# Patient Record
Sex: Female | Born: 1975 | Race: Black or African American | Hispanic: Yes | Marital: Married | Smoking: Current every day smoker
Health system: Southern US, Community
[De-identification: ages and names within clinical notes are randomized; demographics above are authoritative.]

## PROBLEM LIST (undated history)

## (undated) DIAGNOSIS — J439 Emphysema, unspecified: Secondary | ICD-10-CM

## (undated) DIAGNOSIS — J449 Chronic obstructive pulmonary disease, unspecified: Secondary | ICD-10-CM

---

## 2004-05-03 ENCOUNTER — Emergency Department: Payer: Self-pay | Admitting: Unknown Physician Specialty

## 2004-05-04 ENCOUNTER — Ambulatory Visit: Payer: Self-pay | Admitting: Unknown Physician Specialty

## 2005-04-07 ENCOUNTER — Emergency Department: Payer: Self-pay | Admitting: Emergency Medicine

## 2006-05-06 ENCOUNTER — Emergency Department: Payer: Self-pay | Admitting: Internal Medicine

## 2006-06-01 ENCOUNTER — Emergency Department: Payer: Self-pay | Admitting: Emergency Medicine

## 2006-07-09 ENCOUNTER — Emergency Department: Payer: Self-pay | Admitting: Emergency Medicine

## 2007-08-19 ENCOUNTER — Emergency Department: Payer: Self-pay | Admitting: Emergency Medicine

## 2008-12-16 ENCOUNTER — Ambulatory Visit: Payer: Self-pay | Admitting: Internal Medicine

## 2009-03-14 ENCOUNTER — Emergency Department: Payer: Self-pay | Admitting: Unknown Physician Specialty

## 2009-10-27 ENCOUNTER — Observation Stay: Payer: Self-pay | Admitting: Obstetrics and Gynecology

## 2009-12-05 ENCOUNTER — Ambulatory Visit: Payer: Self-pay | Admitting: Family

## 2010-02-19 ENCOUNTER — Observation Stay: Payer: Self-pay

## 2010-02-26 ENCOUNTER — Inpatient Hospital Stay: Payer: Self-pay | Admitting: Obstetrics and Gynecology

## 2012-02-14 ENCOUNTER — Emergency Department: Payer: Self-pay | Admitting: Emergency Medicine

## 2012-10-15 ENCOUNTER — Emergency Department: Payer: Self-pay | Admitting: Emergency Medicine

## 2012-10-15 LAB — TROPONIN I: Troponin-I: <0.02 ng/mL

## 2012-10-15 LAB — URINALYSIS, COMPLETE
Bilirubin,UR: NEGATIVE
Glucose,UR: NEGATIVE mg/dL (ref 0–75)
Ketone: NEGATIVE
Nitrite: NEGATIVE
Ph: 6 (ref 4.5–8.0)
Protein: NEGATIVE
Specific Gravity: 1.005 (ref 1.003–1.030)
Squamous Epithelial: 5

## 2012-10-15 LAB — BASIC METABOLIC PANEL
Anion Gap: 5 — ABNORMAL LOW (ref 7–16)
BUN: 11 mg/dL (ref 7–18)
Chloride: 109 mmol/L — ABNORMAL HIGH (ref 98–107)
EGFR (African American): >60
EGFR (Non-African Amer.): >60
Osmolality: 276 (ref 275–301)

## 2012-10-15 LAB — CBC
MCHC: 32.8 g/dL (ref 32.0–36.0)
MCV: 94 fL (ref 80–100)

## 2013-04-20 ENCOUNTER — Ambulatory Visit: Payer: Self-pay | Admitting: Family Medicine

## 2013-05-06 ENCOUNTER — Ambulatory Visit: Payer: Self-pay | Admitting: Medical

## 2013-05-09 ENCOUNTER — Encounter: Payer: Self-pay | Admitting: Family Medicine

## 2014-12-25 ENCOUNTER — Ambulatory Visit
Admission: EM | Admit: 2014-12-25 | Discharge: 2014-12-25 | Disposition: A | Discharge status: Home / Self Care | Payer: Medicaid Other | Attending: Internal Medicine | Admitting: Internal Medicine

## 2014-12-25 DIAGNOSIS — M7541 Impingement syndrome of right shoulder: Secondary | ICD-10-CM

## 2014-12-25 HISTORY — DX: Chronic obstructive pulmonary disease, unspecified: J44.9

## 2014-12-25 NOTE — Discharge Instructions (Signed)
As you are already doing, rest when shoulder is getting sore. Take advil or aleve to help with discomfort. Ice alternating with heat to help with discomfort also. Do not overdo with the exercises in this handout, but use what seems helpful.  Impingement Syndrome, Rotator Cuff, Bursitis with Rehab Impingement syndrome is a condition that involves inflammation of the tendons of the rotator cuff and the subacromial bursa, that causes pain in the shoulder. The rotator cuff consists of four tendons and muscles that control much of the shoulder and upper arm function. The subacromial bursa is a fluid filled sac that helps reduce friction between the rotator cuff and one of the bones of the shoulder (acromion). Impingement syndrome is usually an overuse injury that causes swelling of the bursa (bursitis), swelling of the tendon (tendonitis), and/or a tear of the tendon (strain). Strains are classified into three categories. Grade 1 strains cause pain, but the tendon is not lengthened. Grade 2 strains include a lengthened ligament, due to the ligament being stretched or partially ruptured. With grade 2 strains there is still function, although the function may be decreased. Grade 3 strains include a complete tear of the tendon or muscle, and function is usually impaired. SYMPTOMS   Pain around the shoulder, often at the outer portion of the upper arm.  Pain that gets worse with shoulder function, especially when reaching overhead or lifting.  Sometimes, aching when not using the arm.  Pain that wakes you up at night.  Sometimes, tenderness, swelling, warmth, or redness over the affected area.  Loss of strength.  Limited motion of the shoulder, especially reaching behind the back (to the back pocket or to unhook bra) or across your body.  Crackling sound (crepitation) when moving the arm.  Biceps tendon pain and inflammation (in the front of the shoulder). Worse when bending the elbow or  lifting. CAUSES  Impingement syndrome is often an overuse injury, in which chronic (repetitive) motions cause the tendons or bursa to become inflamed. A strain occurs when a force is paced on the tendon or muscle that is greater than it can withstand. Common mechanisms of injury include: Stress from sudden increase in duration, frequency, or intensity of training.  Direct hit (trauma) to the shoulder.  Aging, erosion of the tendon with normal use.  Bony bump on shoulder (acromial spur). RISK INCREASES WITH:  Contact sports (football, wrestling, boxing).  Throwing sports (baseball, tennis, volleyball).  Weightlifting and bodybuilding.  Heavy labor.  Previous injury to the rotator cuff, including impingement.  Poor shoulder strength and flexibility.  Failure to warm up properly before activity.  Inadequate protective equipment.  Old age.  Bony bump on shoulder (acromial spur). PREVENTION   Warm up and stretch properly before activity.  Allow for adequate recovery between workouts.  Maintain physical fitness:  Strength, flexibility, and endurance.  Cardiovascular fitness.  Learn and use proper exercise technique. PROGNOSIS  If treated properly, impingement syndrome usually goes away within 6 weeks. Sometimes surgery is required.  RELATED COMPLICATIONS   Longer healing time if not properly treated, or if not given enough time to heal.  Recurring symptoms, that result in a chronic condition.  Shoulder stiffness, frozen shoulder, or loss of motion.  Rotator cuff tendon tear.  Recurring symptoms, especially if activity is resumed too soon, with overuse, with a direct blow, or when using poor technique. TREATMENT  Treatment first involves the use of ice and medicine, to reduce pain and inflammation. The use of strengthening and stretching  exercises may help reduce pain with activity. These exercises may be performed at home or with a therapist. If non-surgical  treatment is unsuccessful after more than 6 months, surgery may be advised. After surgery and rehabilitation, activity is usually possible in 3 months.  MEDICATION  If pain medicine is needed, nonsteroidal anti-inflammatory medicines (aspirin and ibuprofen), or other minor pain relievers (acetaminophen), are often advised.  Do not take pain medicine for 7 days before surgery.  Prescription pain relievers may be given, if your caregiver thinks they are needed. Use only as directed and only as much as you need.  Corticosteroid injections may be given by your caregiver. These injections should be reserved for the most serious cases, because they may only be given a certain number of times. HEAT AND COLD  Cold treatment (icing) should be applied for 10 to 15 minutes every 2 to 3 hours for inflammation and pain, and immediately after activity that aggravates your symptoms. Use ice packs or an ice massage.  Heat treatment may be used before performing stretching and strengthening activities prescribed by your caregiver, physical therapist, or athletic trainer. Use a heat pack or a warm water soak. SEEK MEDICAL CARE IF:   Symptoms get worse or do not improve in 4 to 6 weeks, despite treatment.  New, unexplained symptoms develop. (Drugs used in treatment may produce side effects.) EXERCISES  RANGE OF MOTION (ROM) AND STRETCHING EXERCISES - Impingement Syndrome (Rotator Cuff  Tendinitis, Bursitis) These exercises may help you when beginning to rehabilitate your injury. Your symptoms may go away with or without further involvement from your physician, physical therapist or athletic trainer. While completing these exercises, remember:   Restoring tissue flexibility helps normal motion to return to the joints. This allows healthier, less painful movement and activity.  An effective stretch should be held for at least 30 seconds.  A stretch should never be painful. You should only feel a gentle  lengthening or release in the stretched tissue. STRETCH - Flexion, Standing  Stand with good posture. With an underhand grip on your right / left hand, and an overhand grip on the opposite hand, grasp a broomstick or cane so that your hands are a little more than shoulder width apart.  Keeping your right / left elbow straight and shoulder muscles relaxed, push the stick with your opposite hand, to raise your right / left arm in front of your body and then overhead. Raise your arm until you feel a stretch in your right / left shoulder, but before you have increased shoulder pain.  Try to avoid shrugging your right / left shoulder as your arm rises, by keeping your shoulder blade tucked down and toward your mid-back spine. Hold for __________ seconds.  Slowly return to the starting position. Repeat __________ times. Complete this exercise __________ times per day. STRETCH - Abduction, Supine  Lie on your back. With an underhand grip on your right / left hand and an overhand grip on the opposite hand, grasp a broomstick or cane so that your hands are a little more than shoulder width apart.  Keeping your right / left elbow straight and your shoulder muscles relaxed, push the stick with your opposite hand, to raise your right / left arm out to the side of your body and then overhead. Raise your arm until you feel a stretch in your right / left shoulder, but before you have increased shoulder pain.  Try to avoid shrugging your right / left shoulder as your  arm rises, by keeping your shoulder blade tucked down and toward your mid-back spine. Hold for __________ seconds.  Slowly return to the starting position. Repeat __________ times. Complete this exercise __________ times per day. ROM - Flexion, Active-Assisted  Lie on your back. You may bend your knees for comfort.  Grasp a broomstick or cane so your hands are about shoulder width apart. Your right / left hand should grip the end of the stick,  so that your hand is positioned "thumbs-up," as if you were about to shake hands.  Using your healthy arm to lead, raise your right / left arm overhead, until you feel a gentle stretch in your shoulder. Hold for __________ seconds.  Use the stick to assist in returning your right / left arm to its starting position. Repeat __________ times. Complete this exercise __________ times per day.  ROM - Internal Rotation, Supine   Lie on your back on a firm surface. Place your right / left elbow about 60 degrees away from your side. Elevate your elbow with a folded towel, so that the elbow and shoulder are the same height.  Using a broomstick or cane and your strong arm, pull your right / left hand toward your body until you feel a gentle stretch, but no increase in your shoulder pain. Keep your shoulder and elbow in place throughout the exercise.  Hold for __________ seconds. Slowly return to the starting position. Repeat __________ times. Complete this exercise __________ times per day. STRETCH - Internal Rotation  Place your right / left hand behind your back, palm up.  Throw a towel or belt over your opposite shoulder. Grasp the towel with your right / left hand.  While keeping an upright posture, gently pull up on the towel, until you feel a stretch in the front of your right / left shoulder.  Avoid shrugging your right / left shoulder as your arm rises, by keeping your shoulder blade tucked down and toward your mid-back spine.  Hold for __________ seconds. Release the stretch, by lowering your healthy hand. Repeat __________ times. Complete this exercise __________ times per day. ROM - Internal Rotation   Using an underhand grip, grasp a stick behind your back with both hands.  While standing upright with good posture, slide the stick up your back until you feel a mild stretch in the front of your shoulder.  Hold for __________ seconds. Slowly return to your starting position. Repeat  __________ times. Complete this exercise __________ times per day.  STRETCH - Posterior Shoulder Capsule   Stand or sit with good posture. Grasp your right / left elbow and draw it across your chest, keeping it at the same height as your shoulder.  Pull your elbow, so your upper arm comes in closer to your chest. Pull until you feel a gentle stretch in the back of your shoulder.  Hold for __________ seconds. Repeat __________ times. Complete this exercise __________ times per day. STRENGTHENING EXERCISES - Impingement Syndrome (Rotator Cuff Tendinitis, Bursitis) These exercises may help you when beginning to rehabilitate your injury. They may resolve your symptoms with or without further involvement from your physician, physical therapist or athletic trainer. While completing these exercises, remember:  Muscles can gain both the endurance and the strength needed for everyday activities through controlled exercises.  Complete these exercises as instructed by your physician, physical therapist or athletic trainer. Increase the resistance and repetitions only as guided.  You may experience muscle soreness or fatigue, but the pain  or discomfort you are trying to eliminate should never worsen during these exercises. If this pain does get worse, stop and make sure you are following the directions exactly. If the pain is still present after adjustments, discontinue the exercise until you can discuss the trouble with your clinician.  During your recovery, avoid activity or exercises which involve actions that place your injured hand or elbow above your head or behind your back or head. These positions stress the tissues which you are trying to heal. STRENGTH - Scapular Depression and Adduction   With good posture, sit on a firm chair. Support your arms in front of you, with pillows, arm rests, or on a table top. Have your elbows in line with the sides of your body.  Gently draw your shoulder blades  down and toward your mid-back spine. Gradually increase the tension, without tensing the muscles along the top of your shoulders and the back of your neck.  Hold for __________ seconds. Slowly release the tension and relax your muscles completely before starting the next repetition.  After you have practiced this exercise, remove the arm support and complete the exercise in standing as well as sitting position. Repeat __________ times. Complete this exercise __________ times per day.  STRENGTH - Shoulder Abductors, Isometric  With good posture, stand or sit about 4-6 inches from a wall, with your right / left side facing the wall.  Bend your right / left elbow. Gently press your right / left elbow into the wall. Increase the pressure gradually, until you are pressing as hard as you can, without shrugging your shoulder or increasing any shoulder discomfort.  Hold for __________ seconds.  Release the tension slowly. Relax your shoulder muscles completely before you begin the next repetition. Repeat __________ times. Complete this exercise __________ times per day.  STRENGTH - External Rotators, Isometric  Keep your right / left elbow at your side and bend it 90 degrees.  Step into a door frame so that the outside of your right / left wrist can press against the door frame without your upper arm leaving your side.  Gently press your right / left wrist into the door frame, as if you were trying to swing the back of your hand away from your stomach. Gradually increase the tension, until you are pressing as hard as you can, without shrugging your shoulder or increasing any shoulder discomfort.  Hold for __________ seconds.  Release the tension slowly. Relax your shoulder muscles completely before you begin the next repetition. Repeat __________ times. Complete this exercise __________ times per day.  STRENGTH - Supraspinatus   Stand or sit with good posture. Grasp a __________ weight, or an  exercise band or tubing, so that your hand is "thumbs-up," like you are shaking hands.  Slowly lift your right / left arm in a "V" away from your thigh, diagonally into the space between your side and straight ahead. Lift your hand to shoulder height or as far as you can, without increasing any shoulder pain. At first, many people do not lift their hands above shoulder height.  Avoid shrugging your right / left shoulder as your arm rises, by keeping your shoulder blade tucked down and toward your mid-back spine.  Hold for __________ seconds. Control the descent of your hand, as you slowly return to your starting position. Repeat __________ times. Complete this exercise __________ times per day.  STRENGTH - External Rotators  Secure a rubber exercise band or tubing to a fixed  object (table, pole) so that it is at the same height as your right / left elbow when you are standing or sitting on a firm surface.  Stand or sit so that the secured exercise band is at your uninjured side.  Bend your right / left elbow 90 degrees. Place a folded towel or small pillow under your right / left arm, so that your elbow is a few inches away from your side.  Keeping the tension on the exercise band, pull it away from your body, as if pivoting on your elbow. Be sure to keep your body steady, so that the movement is coming only from your rotating shoulder.  Hold for __________ seconds. Release the tension in a controlled manner, as you return to the starting position. Repeat __________ times. Complete this exercise __________ times per day.  STRENGTH - Internal Rotators   Secure a rubber exercise band or tubing to a fixed object (table, pole) so that it is at the same height as your right / left elbow when you are standing or sitting on a firm surface.  Stand or sit so that the secured exercise band is at your right / left side.  Bend your elbow 90 degrees. Place a folded towel or small pillow under your right  / left arm so that your elbow is a few inches away from your side.  Keeping the tension on the exercise band, pull it across your body, toward your stomach. Be sure to keep your body steady, so that the movement is coming only from your rotating shoulder.  Hold for __________ seconds. Release the tension in a controlled manner, as you return to the starting position. Repeat __________ times. Complete this exercise __________ times per day.  STRENGTH - Scapular Protractors, Standing   Stand arms length away from a wall. Place your hands on the wall, keeping your elbows straight.  Begin by dropping your shoulder blades down and toward your mid-back spine.  To strengthen your protractors, keep your shoulder blades down, but slide them forward on your rib cage. It will feel as if you are lifting the back of your rib cage away from the wall. This is a subtle motion and can be challenging to complete. Ask your caregiver for further instruction, if you are not sure you are doing the exercise correctly.  Hold for __________ seconds. Slowly return to the starting position, resting the muscles completely before starting the next repetition. Repeat __________ times. Complete this exercise __________ times per day. STRENGTH - Scapular Protractors, Supine  Lie on your back on a firm surface. Extend your right / left arm straight into the air while holding a __________ weight in your hand.  Keeping your head and back in place, lift your shoulder off the floor.  Hold for __________ seconds. Slowly return to the starting position, and allow your muscles to relax completely before starting the next repetition. Repeat __________ times. Complete this exercise __________ times per day. STRENGTH - Scapular Protractors, Quadruped  Get onto your hands and knees, with your shoulders directly over your hands (or as close as you can be, comfortably).  Keeping your elbows locked, lift the back of your rib cage up  into your shoulder blades, so your mid-back rounds out. Keep your neck muscles relaxed.  Hold this position for __________ seconds. Slowly return to the starting position and allow your muscles to relax completely before starting the next repetition. Repeat __________ times. Complete this exercise __________ times per day.  STRENGTH - Scapular Retractors  Secure a rubber exercise band or tubing to a fixed object (table, pole), so that it is at the height of your shoulders when you are either standing, or sitting on a firm armless chair.  With a palm down grip, grasp an end of the band in each hand. Straighten your elbows and lift your hands straight in front of you, at shoulder height. Step back, away from the secured end of the band, until it becomes tense.  Squeezing your shoulder blades together, draw your elbows back toward your sides, as you bend them. Keep your upper arms lifted away from your body throughout the exercise.  Hold for __________ seconds. Slowly ease the tension on the band, as you reverse the directions and return to the starting position. Repeat __________ times. Complete this exercise __________ times per day. STRENGTH - Shoulder Extensors   Secure a rubber exercise band or tubing to a fixed object (table, pole) so that it is at the height of your shoulders when you are either standing, or sitting on a firm armless chair.  With a thumbs-up grip, grasp an end of the band in each hand. Straighten your elbows and lift your hands straight in front of you, at shoulder height. Step back, away from the secured end of the band, until it becomes tense.  Squeezing your shoulder blades together, pull your hands down to the sides of your thighs. Do not allow your hands to go behind you.  Hold for __________ seconds. Slowly ease the tension on the band, as you reverse the directions and return to the starting position. Repeat __________ times. Complete this exercise __________ times  per day.  STRENGTH - Scapular Retractors and External Rotators   Secure a rubber exercise band or tubing to a fixed object (table, pole) so that it is at the height as your shoulders, when you are either standing, or sitting on a firm armless chair.  With a palm down grip, grasp an end of the band in each hand. Bend your elbows 90 degrees and lift your elbows to shoulder height, at your sides. Step back, away from the secured end of the band, until it becomes tense.  Squeezing your shoulder blades together, rotate your shoulders so that your upper arms and elbows remain stationary, but your fists travel upward to head height.  Hold for __________ seconds. Slowly ease the tension on the band, as you reverse the directions and return to the starting position. Repeat __________ times. Complete this exercise __________ times per day.  STRENGTH - Scapular Retractors and External Rotators, Rowing   Secure a rubber exercise band or tubing to a fixed object (table, pole) so that it is at the height of your shoulders, when you are either standing, or sitting on a firm armless chair.  With a palm down grip, grasp an end of the band in each hand. Straighten your elbows and lift your hands straight in front of you, at shoulder height. Step back, away from the secured end of the band, until it becomes tense.  Step 1: Squeeze your shoulder blades together. Bending your elbows, draw your hands to your chest, as if you are rowing a boat. At the end of this motion, your hands and elbow should be at shoulder height and your elbows should be out to your sides.  Step 2: Rotate your shoulders, to raise your hands above your head. Your forearms should be vertical and your upper arms should be horizontal.  Hold for __________ seconds. Slowly ease the tension on the band, as you reverse the directions and return to the starting position. Repeat __________ times. Complete this exercise __________ times per day.   STRENGTH - Scapular Depressors  Find a sturdy chair without wheels, such as a dining room chair.  Keeping your feet on the floor, and your hands on the chair arms, lift your bottom up from the seat, and lock your elbows.  Keeping your elbows straight, allow gravity to pull your body weight down. Your shoulders will rise toward your ears.  Raise your body against gravity by drawing your shoulder blades down your back, shortening the distance between your shoulders and ears. Although your feet should always maintain contact with the floor, your feet should progressively support less body weight, as you get stronger.  Hold for __________ seconds. In a controlled and slow manner, lower your body weight to begin the next repetition. Repeat __________ times. Complete this exercise __________ times per day.  Document Released: 06/16/2005 Document Revised: 09/08/2011 Document Reviewed: 09/28/2008 [facility] Patient Information 2015 [place], [place]. This information is not intended to replace advice given to you by your health care provider. Make sure you discuss any questions you have with your health care provider.
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
## 2014-12-25 NOTE — ED Notes (Signed)
Was lifting heavy boxes on Thursday (5 days ago) and right shoulder felt sore afterward. "have always had some shoulder problem before that is usually worst during raining days). Pt put ice on it and it did helped. Denies taking any home medications. Wants to come in to have it check prior to returning back to work. Full ROM.

## 2015-03-05 ENCOUNTER — Ambulatory Visit: Payer: Medicaid Other

## 2015-03-05 ENCOUNTER — Ambulatory Visit
Admission: EM | Admit: 2015-03-05 | Discharge: 2015-03-05 | Disposition: A | Discharge status: Home / Self Care | Payer: Medicaid Other | Attending: Family Medicine | Admitting: Family Medicine

## 2015-03-05 ENCOUNTER — Encounter: Payer: Self-pay | Admitting: Emergency Medicine

## 2015-03-05 DIAGNOSIS — W228XXA Striking against or struck by other objects, initial encounter: Secondary | ICD-10-CM | POA: Diagnosis not present

## 2015-03-05 DIAGNOSIS — S9032XA Contusion of left foot, initial encounter: Secondary | ICD-10-CM | POA: Diagnosis not present

## 2015-03-05 DIAGNOSIS — S90112A Contusion of left great toe without damage to nail, initial encounter: Secondary | ICD-10-CM | POA: Diagnosis not present

## 2015-03-05 DIAGNOSIS — F17210 Nicotine dependence, cigarettes, uncomplicated: Secondary | ICD-10-CM | POA: Insufficient documentation

## 2015-03-05 DIAGNOSIS — J449 Chronic obstructive pulmonary disease, unspecified: Secondary | ICD-10-CM | POA: Diagnosis not present

## 2015-03-05 DIAGNOSIS — S99822A Other specified injuries of left foot, initial encounter: Secondary | ICD-10-CM | POA: Diagnosis present

## 2015-03-05 DIAGNOSIS — Y939 Activity, unspecified: Secondary | ICD-10-CM | POA: Diagnosis not present

## 2015-03-05 MED ORDER — TRAMADOL HCL 50 MG PO TABS: 50.0000 mg | ORAL_TABLET | Freq: Three times a day (TID) | ORAL | Status: DC | PRN

## 2015-03-05 MED ORDER — IBUPROFEN 600 MG PO TABS: 600.0000 mg | ORAL_TABLET | Freq: Three times a day (TID) | ORAL | Status: DC | PRN
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
## 2015-03-05 NOTE — Discharge Instructions (Signed)
Take medication as prescribed. Apply ice and elevate. Use post-operative shoe and crutches for 2-3 days or as long as pain continues.   Follow up with your primary care physician or orthopedic above next week as needed for continued pain. Return to Urgent care for new or worsening concerns.   Contusion A contusion is a deep bruise. Contusions are the result of an injury that caused bleeding under the skin. The contusion may turn blue, purple, or yellow. Minor injuries will give you a painless contusion, but more severe contusions may stay painful and swollen for a few weeks.  CAUSES  A contusion is usually caused by a blow, trauma, or direct force to an area of the body. SYMPTOMS   Swelling and redness of the injured area.  Bruising of the injured area.  Tenderness and soreness of the injured area.  Pain. DIAGNOSIS  The diagnosis can be made by taking a history and physical exam. An X-ray, CT scan, or MRI may be needed to determine if there were any associated injuries, such as fractures. TREATMENT  Specific treatment will depend on what area of the body was injured. In general, the best treatment for a contusion is resting, icing, elevating, and applying cold compresses to the injured area. Over-the-counter medicines may also be recommended for pain control. Ask your caregiver what the best treatment is for your contusion. HOME CARE INSTRUCTIONS   Put ice on the injured area.  Put ice in a plastic bag.  Place a towel between your skin and the bag.  Leave the ice on for 15-20 minutes, 3-4 times a day, or as directed by your health care provider.  Only take over-the-counter or prescription medicines for pain, discomfort, or fever as directed by your caregiver. Your caregiver may recommend avoiding anti-inflammatory medicines (aspirin, ibuprofen, and naproxen) for 48 hours because these medicines may increase bruising.  Rest the injured area.  If possible, elevate the injured area to  reduce swelling. SEEK IMMEDIATE MEDICAL CARE IF:   You have increased bruising or swelling.  You have pain that is getting worse.  Your swelling or pain is not relieved with medicines. MAKE SURE YOU:   Understand these instructions.  Will watch your condition.  Will get help right away if you are not doing well or get worse. Document Released: 03/26/2005 Document Revised: 06/21/2013 Document Reviewed: 04/21/2011 [facility] Patient Information 2015 [place], [place]. This information is not intended to replace advice given to you by your health care provider. Make sure you discuss any questions you have with your health care provider.

## 2015-10-29 ENCOUNTER — Emergency Department: Payer: Medicaid Other

## 2015-10-29 ENCOUNTER — Encounter: Payer: Self-pay | Admitting: Emergency Medicine

## 2015-10-29 ENCOUNTER — Emergency Department
Admission: EM | Admit: 2015-10-29 | Discharge: 2015-10-29 | Disposition: A | Discharge status: Home / Self Care | Payer: Medicaid Other | Attending: Emergency Medicine | Admitting: Emergency Medicine

## 2015-10-29 DIAGNOSIS — F438 Other reactions to severe stress: Secondary | ICD-10-CM | POA: Insufficient documentation

## 2015-10-29 DIAGNOSIS — Z659 Problem related to unspecified psychosocial circumstances: Secondary | ICD-10-CM

## 2015-10-29 DIAGNOSIS — J441 Chronic obstructive pulmonary disease with (acute) exacerbation: Secondary | ICD-10-CM | POA: Diagnosis not present

## 2015-10-29 DIAGNOSIS — F17210 Nicotine dependence, cigarettes, uncomplicated: Secondary | ICD-10-CM | POA: Diagnosis not present

## 2015-10-29 DIAGNOSIS — R0789 Other chest pain: Secondary | ICD-10-CM | POA: Diagnosis present

## 2015-10-29 LAB — COMPREHENSIVE METABOLIC PANEL
ALT: 12 U/L — AB (ref 14–54)
ANION GAP: 8 (ref 5–15)
AST: 13 U/L — ABNORMAL LOW (ref 15–41)
Albumin: 4.6 g/dL (ref 3.5–5.0)
Alkaline Phosphatase: 63 U/L (ref 38–126)
BUN: 10 mg/dL (ref 6–20)
CHLORIDE: 108 mmol/L (ref 101–111)
CO2: 24 mmol/L (ref 22–32)
CREATININE: 0.69 mg/dL (ref 0.44–1.00)
Calcium: 9.2 mg/dL (ref 8.9–10.3)
GFR calc non Af Amer: >60 mL/min (ref >60)
Glucose, Bld: 103 mg/dL — ABNORMAL HIGH (ref 65–99)
POTASSIUM: 3.6 mmol/L (ref 3.5–5.1)
SODIUM: 140 mmol/L (ref 135–145)
Total Bilirubin: 0.9 mg/dL (ref 0.3–1.2)
Total Protein: 7.5 g/dL (ref 6.5–8.1)

## 2015-10-29 LAB — CBC
HCT: 39.1 % (ref 35.0–47.0)
Hemoglobin: 13.2 g/dL (ref 12.0–16.0)
MCH: 31.4 pg (ref 26.0–34.0)
MCHC: 33.6 g/dL (ref 32.0–36.0)
MCV: 93.4 fL (ref 80.0–100.0)
PLATELETS: 245 10*3/uL (ref 150–440)
RBC: 4.18 MIL/uL (ref 3.80–5.20)
RDW: 13.6 % (ref 11.5–14.5)
WBC: 9 10*3/uL (ref 3.6–11.0)

## 2015-10-29 LAB — TROPONIN I: Troponin I: <0.03 ng/mL (ref <0.031)

## 2015-10-29 MED ORDER — LORAZEPAM 0.5 MG PO TABS: 0.5000 mg | ORAL_TABLET | Freq: Every evening | ORAL | Status: AC | PRN

## 2015-10-29 MED ORDER — IBUPROFEN 400 MG PO TABS
600.0000 mg | ORAL_TABLET | ORAL | Status: AC
  Administered 2015-10-29: 600 mg via ORAL

## 2015-10-29 MED ORDER — IPRATROPIUM-ALBUTEROL 0.5-2.5 (3) MG/3ML IN SOLN
RESPIRATORY_TRACT | Status: AC
  Administered 2015-10-29: 3 mL via RESPIRATORY_TRACT
  Filled 2015-10-29: qty 3

## 2015-10-29 MED ORDER — PREDNISONE 20 MG PO TABS
40.0000 mg | ORAL_TABLET | Freq: Once | ORAL | Status: AC
  Administered 2015-10-29: 40 mg via ORAL

## 2015-10-29 MED ORDER — NICOTINE 21 MG/24HR TD PT24
MEDICATED_PATCH | TRANSDERMAL | Status: AC
  Administered 2015-10-29: 21 mg via TRANSDERMAL
  Filled 2015-10-29: qty 1

## 2015-10-29 MED ORDER — IBUPROFEN 600 MG PO TABS
ORAL_TABLET | ORAL | Status: AC
  Administered 2015-10-29: 600 mg via ORAL
  Filled 2015-10-29: qty 1

## 2015-10-29 MED ORDER — ALBUTEROL SULFATE HFA 108 (90 BASE) MCG/ACT IN AERS: 2.0000 | INHALATION_SPRAY | Freq: Four times a day (QID) | RESPIRATORY_TRACT | Status: DC | PRN

## 2015-10-29 MED ORDER — PREDNISONE 20 MG PO TABS
ORAL_TABLET | ORAL | Status: AC
  Administered 2015-10-29: 40 mg via ORAL
  Filled 2015-10-29: qty 2

## 2015-10-29 MED ORDER — IPRATROPIUM-ALBUTEROL 0.5-2.5 (3) MG/3ML IN SOLN
3.0000 mL | Freq: Once | RESPIRATORY_TRACT | Status: AC
  Administered 2015-10-29: 3 mL via RESPIRATORY_TRACT

## 2015-10-29 MED ORDER — NICOTINE 21 MG/24HR TD PT24
21.0000 mg | MEDICATED_PATCH | Freq: Once | TRANSDERMAL | Status: DC
  Administered 2015-10-29: 21 mg via TRANSDERMAL

## 2015-10-29 MED ORDER — PREDNISONE 20 MG PO TABS: 40.0000 mg | ORAL_TABLET | Freq: Every day | ORAL | Status: DC

## 2015-10-29 NOTE — Discharge Instructions (Signed)
We believe that your symptoms are caused today by an exacerbation of your COPD, anxiety, and possibly bronchitis.  Please take the prescribed medications and any medications that you have at home for your COPD.  Follow up with your doctor as recommended.  If you develop any new or worsening symptoms, including but not limited to fever, persistent vomiting, worsening shortness of breath, or other symptoms that concern you, please return to the Emergency Department immediately.  No driving within 8 hours of using Ativan.   Chronic Obstructive Pulmonary Disease Exacerbation Chronic obstructive pulmonary disease (COPD) is a common lung condition in which airflow from the lungs is limited. COPD is a general term that can be used to describe many different lung problems that limit airflow, including chronic bronchitis and emphysema. COPD exacerbations are episodes when breathing symptoms become much worse and require extra treatment. Without treatment, COPD exacerbations can be life threatening, and frequent COPD exacerbations can cause further damage to your lungs. CAUSES  Respiratory infections.  Exposure to smoke.  Exposure to air pollution, chemical fumes, or dust. Sometimes there is no apparent cause or trigger. RISK FACTORS  Smoking cigarettes.  Older age.  Frequent prior COPD exacerbations. SIGNS AND SYMPTOMS  Increased coughing.  Increased thick spit (sputum) production.  Increased wheezing.  Increased shortness of breath.  Rapid breathing.  Chest tightness. DIAGNOSIS Your medical history, a physical exam, and tests will help your health care provider make a diagnosis. Tests may include:  A chest X-ray.  Basic lab tests.  Sputum testing.  An arterial blood gas test. TREATMENT Depending on the severity of your COPD exacerbation, you may need to be admitted to a hospital for treatment. Some of the treatments commonly used to treat COPD exacerbations are:   Antibiotic  medicines.  Bronchodilators. These are drugs that expand the air passages. They may be given with an inhaler or nebulizer. Spacer devices may be needed to help improve drug delivery.  Corticosteroid medicines.  Supplemental oxygen therapy.  Airway clearing techniques, such as noninvasive ventilation (NIV) and positive expiratory pressure (PEP). These provide respiratory support through a mask or other noninvasive device. HOME CARE INSTRUCTIONS  Do not smoke. Quitting smoking is very important to prevent COPD from getting worse and exacerbations from happening as often.  Avoid exposure to all substances that irritate the airway, especially to tobacco smoke.  If you were prescribed an antibiotic medicine, finish it all even if you start to feel better.  Take all medicines as directed by your health care provider.It is important to use correct technique with inhaled medicines.  Drink enough fluids to keep your urine clear or pale yellow (unless you have a medical condition that requires fluid restriction).  Use a cool mist vaporizer. This makes it easier to clear your chest when you cough.  If you have a home nebulizer and oxygen, continue to use them as directed.  Maintain all necessary vaccinations to prevent infections.  Exercise regularly.  Eat a healthy diet.  Keep all follow-up appointments as directed by your health care provider. SEEK IMMEDIATE MEDICAL CARE IF:  You have worsening shortness of breath.  You have trouble talking.  You have severe chest pain.  You have blood in your sputum.  You have a fever.  You have weakness, vomit repeatedly, or faint.  You feel confused.  You continue to get worse. MAKE SURE YOU:  Understand these instructions.  Will watch your condition.  Will get help right away if you are not doing  well or get worse.   This information is not intended to replace advice given to you by your health care provider. Make sure you discuss  any questions you have with your health care provider.   Document Released: 04/13/2007 Document Revised: 07/07/2014 Document Reviewed: 02/18/2013 Else[employer] Interactive Patient Education [employer]

## 2015-10-29 NOTE — ED Notes (Signed)
Pt arrived to the ED for complaints of chest pain x3 days. Pt reports that she is a COPD but this feel different. Pt is AOx4 in no apparent distress.

## 2015-10-29 NOTE — ED Notes (Signed)
E-signature not working for this RN. E-signature page printed out and pt signed on paper.  

## 2015-10-29 NOTE — ED Provider Notes (Signed)
[facility] Emergency Department Provider Note  ____________________________________________  Time seen: Approximately 10:03 PM  I have reviewed the triage vital signs and the nursing notes.   HISTORY  Chief Complaint Chest Pain    HPI Lindsey Campos is a 40 y.o. female history of COPD. Also anxiety.  Patient reports she been feeling tight across her chest kind of "uncomfortable" for the last 3 days. She reports that this seems to be associated with some anxiety and stress related to her 63 year old child.  She denies any severe chest pain, sharp pain, pain is not worsened with inspiration.  Pain is located across the sternum. No associated nausea or vomiting. No radiation of pain. Does not describe it as a heavy chest pain or pressure. She reports it seems to be coming about especially when she gets upset with her 38 year old. She has had a slight nonproductive cough over the last few days as well and occasionally no slight wheezing. Currently not wheezing and denies shortness of breath.  No previous history of blood clots. Denies use any estrogen or birth control. She is a smoker. No leg swelling. No recent trauma or surgery. No productive cough or blood in her sputum.  No long trips or travel.  No history of heart disease. No diabetes, high blood pressure or high cholesterol.  Past Medical History  Diagnosis Date  . COPD (chronic obstructive pulmonary disease) (HCC)     There are no active problems to display for this patient.   History reviewed. No pertinent past surgical history.  Current Outpatient Rx  Name  Route  Sig  Dispense  Refill  . albuterol (PROVENTIL HFA;VENTOLIN HFA) 108 (90 Base) MCG/ACT inhaler   Inhalation   Inhale 2 puffs into the lungs every 6 (six) hours as needed for wheezing or shortness of breath.   1 Inhaler   2   . ibuprofen (ADVIL,MOTRIN) 600 MG tablet   Oral   Take 1 tablet (600 mg total) by mouth every 8 (eight)  hours as needed for mild pain or moderate pain.   15 tablet   0   . LORazepam (ATIVAN) 0.5 MG tablet   Oral   Take 1 tablet (0.5 mg total) by mouth at bedtime as needed for anxiety.   5 tablet   0   . predniSONE (DELTASONE) 20 MG tablet   Oral   Take 2 tablets (40 mg total) by mouth daily.   10 tablet   0   . traMADol (ULTRAM) 50 MG tablet   Oral   Take 1 tablet (50 mg total) by mouth every 8 (eight) hours as needed (Do not drive or operate machinery while taking as can cause drowsiness.).   10 tablet   0     Allergies Review of patient's allergies indicates no known allergies.  History reviewed. No pertinent family history.  Social History Social History  Substance Use Topics  . Smoking status: Current Every Day Smoker -- 1.00 packs/day    Types: Cigarettes  . Smokeless tobacco: None  . Alcohol Use: Yes     Comment: occasionally    Review of Systems Constitutional: No fever/chills Eyes: No visual changes. ENT: No sore throat. Cardiovascular: See history of present illness Respiratory: Denies shortness of breath. See history of present illness Gastrointestinal: No abdominal pain.  No nausea, no vomiting.  No diarrhea.  No constipation. Genitourinary: Negative for dysuria. Musculoskeletal: Negative for back pain. Skin: Negative for rash. Neurological: Negative for headaches, focal weakness or  numbness.  Denies any desire to harm herself, feeling depressed, or having any hallucinations. She does report  being anxious and feeling "stressed out and" at times with her teenage child. Denies any risk for being at risk to harm herself or any one else. Feels safe at home.  10-point ROS otherwise negative.  Denies pregnancy, uses contraceptive measures. Last menstrual period about 2 weeks ago.. ____________________________________________   PHYSICAL EXAM:  VITAL SIGNS: ED Triage Vitals  Enc Vitals Group     BP 10/29/15 2039 124/93 mmHg     Pulse Rate 10/29/15  2039 85     Resp 10/29/15 2039 18     Temp 10/29/15 2039 98.7 F (37.1 C)     Temp Source 10/29/15 2039 Oral     SpO2 10/29/15 2039 100 %     Weight 10/29/15 2039 117 lb (53.071 kg)     Height 10/29/15 2039 5\' 4"  (1.626 m)     Head Cir --      Peak Flow --      Pain Score 10/29/15 2040 8     Pain Loc --      Pain Edu? --      Excl. in GC? --    Constitutional: Alert and oriented. Well appearing and in no acute distress. Slightly anxious. Become slightly tearful and she discusses stress related to her teenage child. Eyes: Conjunctivae are normal. PERRL. EOMI. Head: Atraumatic. Nose: No congestion/rhinnorhea. Mouth/Throat: Mucous membranes are moist.  Oropharynx non-erythematous. Neck: No stridor.   Cardiovascular: Normal rate, regular rhythm. Grossly normal heart sounds.  Good peripheral circulation. Respiratory: Normal respiratory effort.  No retractions. Lungs CTAB. Occasional dry cough. Beats in full and clear sentences. Gastrointestinal: Soft and nontender. No distention. Musculoskeletal: No lower extremity tenderness nor edema.  No joint effusions. Neurologic:  Normal speech and language. No gross focal neurologic deficits are appreciated. No gait instability. Skin:  Skin is warm, dry and intact. No rash noted. Psychiatric: Mood and affect are normal. Speech and behavior are normal.  ____________________________________________   LABS (all labs ordered are listed, but only abnormal results are displayed)  Labs Reviewed  COMPREHENSIVE METABOLIC PANEL - Abnormal; Notable for the following:    Glucose, Bld 103 (*)    AST 13 (*)    ALT 12 (*)    All other components within normal limits  CBC  TROPONIN I  URINALYSIS COMPLETEWITH MICROSCOPIC ([facility] ONLY)   ____________________________________________  EKG  ED ECG REPORT I, Osborne Serio, the attending physician, personally viewed and interpreted this ECG.  Date: 10/29/2015 EKG Time: 2040 Rate: 80 Rhythm: normal sinus  rhythm QRS Axis: normal Intervals: normal ST/T Wave abnormalities: normal Conduction Disturbances: none Narrative Interpretation: unremarkable  ____________________________________________  RADIOLOGY  DG Chest 2 View (Final result) Result time: 10/29/15 21:14:34   Final result by Rad Results In Interface (10/29/15 21:14:34)   Narrative:   CLINICAL DATA: Intermittent chest pain for the past 3 days. Constant substernal chest pain. History of COPD and smoking  EXAM: CHEST 2 VIEW  COMPARISON: 10/15/2012  FINDINGS: Grossly unchanged cardiac silhouette and mediastinal contours. The lungs are hyperexpanded with flattening the diaphragms and diffuse nodular thickening of the pulmonary interstitium. No focal airspace opacities. No pleural effusion or pneumothorax. No evidence of edema. No acute osseus abnormalities.  IMPRESSION: Suspected airways disease / bronchitis superimposed on age advanced emphysematous change. No focal airspace opacities to suggest pneumonia.   Electronically Signed By: Simonne ComeJohn Watts M.D. On: 10/29/2015 21:14    ____________________________________________  PROCEDURES  Procedure(s) performed: None  Critical Care performed: No  ____________________________________________   INITIAL IMPRESSION / ASSESSMENT AND PLAN / ED COURSE  Pertinent labs & imaging results that were available during my care of the patient were reviewed by me and considered in my medical decision making (see chart for details).  Patient presents for evaluation of chest discomfort and anxiety. Appears to be low risk for coronary disease. Troponin negative, 3 days of symptoms with a normal troponin. Her symptoms are very atypical in nature, and seeming only cardiac risk factor is that of her smoking history. EKG normal. Heart score low risk.  I feel after discussing with her that she may have some very early COPD exacerbation symptoms due to 8 dry nonproductive cough.  She felt like she was wheezing earlier but this is resolved. No signs or symptoms suggest acute pulmonary embolism, no significant risk factors aside from smoking history.      Pulmonary Embolism Rule-out Criteria (PERC rule)                        If YES to ANY of the following, the PERC rule is not satisfied and cannot be used to rule out PE in this patient (consider d-dimer or imaging depending on pre-test probability).                      If NO to ALL of the following, AND the clinician's pre-test probability is <15%, the [facility] rule is satisfied and there is no need for further workup (including no need to obtain a d-dimer) as the post-test probability of pulmonary embolism is <2%.                      Mnemonic is HAD CLOTS   H - hormone use (exogenous estrogen)      No. A - age > 50                                                 No. D - DVT/PE history                                      No.   C - coughing blood (hemoptysis)                 No. L - leg swelling, unilateral                             No. O - O2 Sat on Room Air < 95%                  No. T - tachycardia (HR ? 100)                         No. S - surgery or trauma, recent                      No.   Based on my evaluation of the patient, including application of this decision instrument, further testing to evaluate for pulmonary embolism is not indicated at this time.  We will treat her with  a DuoNeb, prednisone, and also discussed careful and cautious use of as needed Ativan for the next few days until she can contact her PCP regarding stress. No signs or symptoms of psychosis, suicidal ideation, or severe depressive/stress symptoms at this time. Patient is agreeable to using and not taking while driving.  ----------------------------------------- 10:46 PM on 10/29/2015 -----------------------------------------  Patient feels improved. Resting comfortably. Plan to follow closely with her doctor. Close and careful  return precautions advised.  Return precautions and treatment recommendations and follow-up discussed with the patient who is agreeable with the plan.   ____________________________________________   FINAL CLINICAL IMPRESSION(S) / ED DIAGNOSES  Final diagnoses:  Other social stressor  COPD exacerbation (HCC)      Sharyn Creamer, MD 10/29/15 2246

## 2015-12-05 ENCOUNTER — Encounter: Payer: Self-pay | Admitting: Emergency Medicine

## 2015-12-05 ENCOUNTER — Emergency Department: Payer: Medicaid Other

## 2015-12-05 ENCOUNTER — Emergency Department
Admission: EM | Admit: 2015-12-05 | Discharge: 2015-12-05 | Disposition: A | Discharge status: Home / Self Care | Payer: Medicaid Other | Attending: Emergency Medicine | Admitting: Emergency Medicine

## 2015-12-05 DIAGNOSIS — M24411 Recurrent dislocation, right shoulder: Secondary | ICD-10-CM | POA: Insufficient documentation

## 2015-12-05 DIAGNOSIS — Z79899 Other long term (current) drug therapy: Secondary | ICD-10-CM | POA: Insufficient documentation

## 2015-12-05 DIAGNOSIS — Z7952 Long term (current) use of systemic steroids: Secondary | ICD-10-CM | POA: Diagnosis not present

## 2015-12-05 DIAGNOSIS — J449 Chronic obstructive pulmonary disease, unspecified: Secondary | ICD-10-CM | POA: Diagnosis not present

## 2015-12-05 DIAGNOSIS — F17210 Nicotine dependence, cigarettes, uncomplicated: Secondary | ICD-10-CM | POA: Diagnosis not present

## 2015-12-05 DIAGNOSIS — M25511 Pain in right shoulder: Secondary | ICD-10-CM | POA: Diagnosis present

## 2015-12-05 MED ORDER — OXYCODONE-ACETAMINOPHEN 5-325 MG PO TABS: 1.0000 | ORAL_TABLET | Freq: Four times a day (QID) | ORAL | Status: DC | PRN

## 2015-12-05 MED ORDER — LIDOCAINE HCL 2 % IJ SOLN
10.0000 mL | Freq: Once | INTRAMUSCULAR | Status: DC
  Filled 2015-12-05: qty 10

## 2015-12-05 MED ORDER — DIAZEPAM 5 MG/ML IJ SOLN
5.0000 mg | Freq: Once | INTRAMUSCULAR | Status: AC
  Administered 2015-12-05: 5 mg via INTRAVENOUS

## 2015-12-05 MED ORDER — PROPOFOL 200 MG/20ML IV EMUL
100.0000 mg | Freq: Once | INTRAVENOUS | Status: DC
  Filled 2015-12-05: qty 20

## 2015-12-05 MED ORDER — DIAZEPAM 5 MG/ML IJ SOLN
INTRAMUSCULAR | Status: AC
  Administered 2015-12-05: 5 mg via INTRAVENOUS
  Filled 2015-12-05: qty 2

## 2015-12-05 MED ORDER — HYDROMORPHONE HCL 1 MG/ML IJ SOLN
1.0000 mg | Freq: Once | INTRAMUSCULAR | Status: DC
Start: 2015-12-05 — End: 2015-12-05

## 2015-12-05 MED ORDER — PROPOFOL 10 MG/ML IV BOLUS
100.0000 mg | Freq: Once | INTRAVENOUS | Status: DC
Start: 2015-12-05 — End: 2015-12-05

## 2015-12-05 MED ORDER — OXYCODONE-ACETAMINOPHEN 5-325 MG PO TABS
2.0000 | ORAL_TABLET | Freq: Once | ORAL | Status: AC
  Administered 2015-12-05: 2 via ORAL
  Filled 2015-12-05: qty 2

## 2015-12-05 MED ORDER — LIDOCAINE-EPINEPHRINE (PF) 1 %-1:200000 IJ SOLN
INTRAMUSCULAR | Status: AC
  Administered 2015-12-05: 30 mL via INTRAMUSCULAR
  Filled 2015-12-05: qty 30

## 2015-12-05 NOTE — ED Notes (Signed)
Patient reports feeling much better after she moved her shoulder.  Patient is in no obvious distress at this time.

## 2015-12-05 NOTE — ED Notes (Signed)
Patient arrived by EMS from home c/o R shoulder pain. HX of shoulder popping out. Patient reports "I rolled on my side and my shoulder popped out". HX COPD, anxiety. EMS administered fentanyl. Patient denies falling or injury to shoulder and states "I usually pop it back in myself but I cannot today"

## 2015-12-05 NOTE — Discharge Instructions (Signed)
Take oxycodone for severe pain.  Take motrin for mild pain.  Wear shoulder immobilizer for 2-3 days.   See orthopedic doctor to discuss potential surgery   Return to ER if you have another shoulder dislocation, severe pain.

## 2015-12-05 NOTE — ED Notes (Signed)
EMS gave another 50mg  of fentanyl at this time.

## 2015-12-05 NOTE — ED Notes (Signed)
Patient given instructions on use of shoulder immobilizer and importance of follow-up with orthopedic physician.  Patient verbalized understanding.

## 2015-12-05 NOTE — ED Provider Notes (Signed)
CSN: [id number]     Arrival date & time 12/05/15  0910 History   First MD Initiated Contact with Patient 12/05/15 [phone]     Chief Complaint  Patient presents with  . Shoulder Pain     (Consider location/radiation/quality/duration/timing/severity/associated sxs/prior Treatment) The history is provided by the patient.  Seema Ronie SpiesM Hageman is a 40 y.o. female hx of COPD, recurrent shoulder dislocations, here with Right shoulder dislocation. She rolled out of bed this morning and noticed that her right shoulder popped out. She states that she had recurrent dislocations but has not has seen with [facility] before. She usually was able to put it back in but was unable to today.      Past Medical History  Diagnosis Date  . COPD (chronic obstructive pulmonary disease) (HCC)    History reviewed. No pertinent past surgical history. No family history on file. Social History  Substance Use Topics  . Smoking status: Current Every Day Smoker -- 1.00 packs/day    Types: Cigarettes  . Smokeless tobacco: None  . Alcohol Use: Yes     Comment: occasionally   OB History    No data available     Review of Systems  Musculoskeletal:       R shoulder pain   All other systems reviewed and are negative.     Allergies  Review of patient's allergies indicates no known allergies.  Home Medications   Prior to Admission medications   Medication Sig Start Date End Date Taking? Authorizing Provider  albuterol (PROVENTIL HFA;VENTOLIN HFA) 108 (90 Base) MCG/ACT inhaler Inhale 2 puffs into the lungs every 6 (six) hours as needed for wheezing or shortness of breath. 10/29/15   Sharyn CreamerMark Quale, MD  ibuprofen (ADVIL,MOTRIN) 600 MG tablet Take 1 tablet (600 mg total) by mouth every 8 (eight) hours as needed for mild pain or moderate pain. 03/05/15   Renford DillsLindsey Miller, NP  LORazepam (ATIVAN) 0.5 MG tablet Take 1 tablet (0.5 mg total) by mouth at bedtime as needed for anxiety. 10/29/15 10/28/16  Sharyn CreamerMark Quale, MD  predniSONE  (DELTASONE) 20 MG tablet Take 2 tablets (40 mg total) by mouth daily. 10/29/15   Sharyn CreamerMark Quale, MD  traMADol (ULTRAM) 50 MG tablet Take 1 tablet (50 mg total) by mouth every 8 (eight) hours as needed (Do not drive or operate machinery while taking as can cause drowsiness.). 03/05/15   Renford DillsLindsey Miller, NP   BP 127/92 mmHg  Pulse 68  Temp(Src) 97.7 F (36.5 C)  Resp 18  Ht 5\' 4"  (1.626 m)  Wt 112 lb (50.803 kg)  BMI 19.22 kg/m2  SpO2 98%  LMP 11/21/2015 (Approximate) Physical Exam  Constitutional: She is oriented to person, place, and time. She appears well-developed.  Uncomfortable   HENT:  Head: Normocephalic.  Eyes: Conjunctivae are normal. Pupils are equal, round, and reactive to light.  Neck: Normal range of motion.  Cardiovascular: Normal rate, regular rhythm and normal heart sounds.   Pulmonary/Chest: Effort normal and breath sounds normal. No respiratory distress. She has no wheezes.  Abdominal: Soft. Bowel sounds are normal. She exhibits no distension. There is no tenderness. There is no rebound.  Musculoskeletal:  R shoulder with obvious anterior dislocation, no obvious deformity, unable to range shoulder. 2+ pulses, able to hand grasp   Neurological: She is alert and oriented to person, place, and time.  Skin: Skin is warm and dry.  Psychiatric: She has a normal mood and affect. Her behavior is normal. Judgment and thought content normal.  Nursing note and vitals reviewed.   ED Course  Reduction of dislocation Date/Time: 12/05/2015 11:23 AM Performed by: Richardean Canal Authorized by: Richardean Canal Consent: Verbal consent obtained. Risks and benefits: risks, benefits and alternatives were discussed Consent given by: patient Patient understanding: patient states understanding of the procedure being performed Patient consent: the patient's understanding of the procedure matches consent given Procedure consent: procedure consent matches procedure scheduled Relevant documents:  relevant documents present and verified Test results: test results available and properly labeled Patient identity confirmed: verbally with patient Local anesthesia used: yes Anesthesia: hematoma block Local anesthetic: lidocaine 2% with epinephrine Anesthetic total: 10 ml Patient sedated: no Patient tolerance: Patient tolerated the procedure well with no immediate complications Comments: Unable to relocate the shoulder due to pain and spasms    (including critical care time) Labs Review Labs Reviewed - No data to display  Imaging Review Dg Shoulder Right  12/05/2015  CLINICAL DATA:  40 year old female with acute on chronic shoulder dislocation this morning. Initial encounter. EXAM: RIGHT SHOULDER - 2+ VIEW COMPARISON:  05/06/2013. FINDINGS: No glenohumeral joint dislocation. Proximal right humerus appears intact. Bone mineralization is within normal limits. Right clavicle and scapula appear intact. Visible right ribs and lung parenchyma within normal limits. IMPRESSION: No dislocation or acute osseous abnormality at the right shoulder. Electronically Signed   By: Odessa Fleming M.D.   On: 12/05/2015 10:56   I have personally reviewed and evaluated these images and lab results as part of my medical decision-making.   EKG Interpretation None      MDM   Final diagnoses:  None    Shandelle LATARRA EAGLETON is a 40 y.o. female here with recurrent shoulder dislocation. R shoulder dislocated clinically, no trauma. Given fentanyl by EMS. I ordered IV valium and performed hematoma block but unable to relocate shoulder. Consented her for conscious sedation but patient moved and relocated her shoulder herself. Confirmed on xray. Placed in shoulder immobilizer. Will dc home with ortho follow up, pain meds      Richardean Canal, MD 12/05/15 1125

## 2016-01-03 ENCOUNTER — Inpatient Hospital Stay
Admission: EM | Admit: 2016-01-03 | Discharge: 2016-01-06 | DRG: 872 | Disposition: A | Discharge status: Home / Self Care | Payer: Medicaid Other | Attending: Internal Medicine | Admitting: Internal Medicine

## 2016-01-03 ENCOUNTER — Emergency Department: Payer: Medicaid Other

## 2016-01-03 ENCOUNTER — Encounter: Payer: Self-pay | Admitting: Emergency Medicine

## 2016-01-03 DIAGNOSIS — E860 Dehydration: Secondary | ICD-10-CM | POA: Diagnosis present

## 2016-01-03 DIAGNOSIS — N390 Urinary tract infection, site not specified: Secondary | ICD-10-CM | POA: Diagnosis present

## 2016-01-03 DIAGNOSIS — F17210 Nicotine dependence, cigarettes, uncomplicated: Secondary | ICD-10-CM | POA: Diagnosis present

## 2016-01-03 DIAGNOSIS — F419 Anxiety disorder, unspecified: Secondary | ICD-10-CM | POA: Diagnosis present

## 2016-01-03 DIAGNOSIS — Z79891 Long term (current) use of opiate analgesic: Secondary | ICD-10-CM

## 2016-01-03 DIAGNOSIS — N739 Female pelvic inflammatory disease, unspecified: Secondary | ICD-10-CM | POA: Diagnosis present

## 2016-01-03 DIAGNOSIS — K529 Noninfective gastroenteritis and colitis, unspecified: Secondary | ICD-10-CM | POA: Diagnosis present

## 2016-01-03 DIAGNOSIS — N760 Acute vaginitis: Secondary | ICD-10-CM | POA: Diagnosis present

## 2016-01-03 DIAGNOSIS — R112 Nausea with vomiting, unspecified: Secondary | ICD-10-CM | POA: Diagnosis present

## 2016-01-03 DIAGNOSIS — E876 Hypokalemia: Secondary | ICD-10-CM | POA: Diagnosis present

## 2016-01-03 DIAGNOSIS — A549 Gonococcal infection, unspecified: Secondary | ICD-10-CM | POA: Diagnosis present

## 2016-01-03 DIAGNOSIS — A419 Sepsis, unspecified organism: Principal | ICD-10-CM | POA: Diagnosis present

## 2016-01-03 DIAGNOSIS — Z7952 Long term (current) use of systemic steroids: Secondary | ICD-10-CM

## 2016-01-03 DIAGNOSIS — J449 Chronic obstructive pulmonary disease, unspecified: Secondary | ICD-10-CM

## 2016-01-03 DIAGNOSIS — K5900 Constipation, unspecified: Secondary | ICD-10-CM | POA: Diagnosis present

## 2016-01-03 DIAGNOSIS — Z79899 Other long term (current) drug therapy: Secondary | ICD-10-CM

## 2016-01-03 LAB — COMPREHENSIVE METABOLIC PANEL
ALT: 16 U/L (ref 14–54)
ANION GAP: 10 (ref 5–15)
AST: 18 U/L (ref 15–41)
Albumin: 4.1 g/dL (ref 3.5–5.0)
Alkaline Phosphatase: 75 U/L (ref 38–126)
BILIRUBIN TOTAL: 1 mg/dL (ref 0.3–1.2)
BUN: 11 mg/dL (ref 6–20)
CHLORIDE: 106 mmol/L (ref 101–111)
CO2: 20 mmol/L — ABNORMAL LOW (ref 22–32)
Calcium: 9.3 mg/dL (ref 8.9–10.3)
Creatinine, Ser: 0.61 mg/dL (ref 0.44–1.00)
Glucose, Bld: 129 mg/dL — ABNORMAL HIGH (ref 65–99)
POTASSIUM: 3.1 mmol/L — AB (ref 3.5–5.1)
Sodium: 136 mmol/L (ref 135–145)
TOTAL PROTEIN: 7.3 g/dL (ref 6.5–8.1)

## 2016-01-03 LAB — CBC WITH DIFFERENTIAL/PLATELET
BASOS ABS: 0 10*3/uL (ref 0–0.1)
BASOS PCT: 0 %
EOS ABS: 0 10*3/uL (ref 0–0.7)
Eosinophils Relative: 0 %
HEMATOCRIT: 38.8 % (ref 35.0–47.0)
Hemoglobin: 13.6 g/dL (ref 12.0–16.0)
Lymphocytes Relative: 2 %
Lymphs Abs: 0.6 10*3/uL — ABNORMAL LOW (ref 1.0–3.6)
MCH: 32.6 pg (ref 26.0–34.0)
MCHC: 35.1 g/dL (ref 32.0–36.0)
MCV: 92.9 fL (ref 80.0–100.0)
MONO ABS: 0.8 10*3/uL (ref 0.2–0.9)
Monocytes Relative: 2 %
NEUTROS ABS: 34 10*3/uL — AB (ref 1.4–6.5)
NEUTROS PCT: 96 %
PLATELETS: 198 10*3/uL (ref 150–440)
RBC: 4.18 MIL/uL (ref 3.80–5.20)
RDW: 13.2 % (ref 11.5–14.5)
WBC: 35.4 10*3/uL — ABNORMAL HIGH (ref 3.6–11.0)

## 2016-01-03 LAB — URINALYSIS COMPLETE WITH MICROSCOPIC (ARMC ONLY)
BACTERIA UA: NONE SEEN
Glucose, UA: NEGATIVE mg/dL
Hgb urine dipstick: NEGATIVE
Nitrite: NEGATIVE
PH: 5 (ref 5.0–8.0)
PROTEIN: 100 mg/dL — AB
SPECIFIC GRAVITY, URINE: 1.029 (ref 1.005–1.030)

## 2016-01-03 LAB — PREGNANCY, URINE: PREG TEST UR: NEGATIVE

## 2016-01-03 LAB — TROPONIN I: TROPONIN I: 0.04 ng/mL — AB (ref <0.03)

## 2016-01-03 LAB — LIPASE, BLOOD: LIPASE: 14 U/L (ref 11–51)

## 2016-01-03 LAB — LACTIC ACID, PLASMA: LACTIC ACID, VENOUS: 2.3 mmol/L — AB (ref 0.5–1.9)

## 2016-01-03 LAB — POCT PREGNANCY, URINE: PREG TEST UR: NEGATIVE

## 2016-01-03 MED ORDER — HYDROMORPHONE HCL 1 MG/ML IJ SOLN
0.5000 mg | Freq: Once | INTRAMUSCULAR | Status: AC
  Administered 2016-01-03: 0.5 mg via INTRAVENOUS
  Filled 2016-01-03: qty 1

## 2016-01-03 MED ORDER — SODIUM CHLORIDE 0.9 % IV BOLUS (SEPSIS)
1000.0000 mL | Freq: Once | INTRAVENOUS | Status: AC
  Administered 2016-01-03: 1000 mL via INTRAVENOUS

## 2016-01-03 MED ORDER — VANCOMYCIN HCL IN DEXTROSE 1-5 GM/200ML-% IV SOLN
1000.0000 mg | Freq: Once | INTRAVENOUS | Status: AC
  Administered 2016-01-03: 1000 mg via INTRAVENOUS
  Filled 2016-01-03: qty 200

## 2016-01-03 MED ORDER — IOPAMIDOL (ISOVUE-300) INJECTION 61%
75.0000 mL | Freq: Once | INTRAVENOUS | Status: AC | PRN
  Administered 2016-01-03: 75 mL via INTRAVENOUS

## 2016-01-03 MED ORDER — ONDANSETRON HCL 4 MG/2ML IJ SOLN
4.0000 mg | Freq: Once | INTRAMUSCULAR | Status: AC | PRN
  Administered 2016-01-03: 4 mg via INTRAVENOUS
  Filled 2016-01-03: qty 2

## 2016-01-03 MED ORDER — ONDANSETRON HCL 4 MG/2ML IJ SOLN
4.0000 mg | Freq: Once | INTRAMUSCULAR | Status: AC
  Administered 2016-01-03: 4 mg via INTRAVENOUS
  Filled 2016-01-03: qty 2

## 2016-01-03 MED ORDER — METRONIDAZOLE IN NACL 5-0.79 MG/ML-% IV SOLN
500.0000 mg | Freq: Once | INTRAVENOUS | Status: AC
  Administered 2016-01-03: 500 mg via INTRAVENOUS
  Filled 2016-01-03: qty 100

## 2016-01-03 MED ORDER — SODIUM CHLORIDE 0.9 % IV BOLUS (SEPSIS)
1000.0000 mL | Freq: Once | INTRAVENOUS | Status: AC
  Administered 2016-01-04: 1000 mL via INTRAVENOUS

## 2016-01-03 MED ORDER — MORPHINE SULFATE (PF) 4 MG/ML IV SOLN
4.0000 mg | Freq: Once | INTRAVENOUS | Status: AC
  Administered 2016-01-03: 4 mg via INTRAVENOUS
  Filled 2016-01-03: qty 1

## 2016-01-03 MED ORDER — PIPERACILLIN-TAZOBACTAM 3.375 G IVPB 30 MIN
3.3750 g | Freq: Once | INTRAVENOUS | Status: AC
  Administered 2016-01-03: 3.375 g via INTRAVENOUS
  Filled 2016-01-03: qty 50

## 2016-01-03 MED ORDER — DIATRIZOATE MEGLUMINE & SODIUM 66-10 % PO SOLN
30.0000 mL | Freq: Once | ORAL | Status: AC
  Administered 2016-01-03: 30 mL via ORAL

## 2016-01-03 NOTE — Progress Notes (Addendum)
Pharmacy Antibiotic Note  Lindsey Campos is a [age] y[age].o. female admitted on 01/03/2016 with sepsis.  Pharmacy has been consulted for vancomycin dosing.  Plan: Vancomycin 1 gm IV x 1 in ED followed in approximately 10 hours (stacked dosing) by vancomycin 750 mg IV Q12H, predicted trough 18 mcg/mL. Pharmacy will continue to follow and adjust as needed to maintain trough 15 to 20 mcg/mL.   Vd 34.6 L, Ke 0.067 hr-1, T1/2 10.4 hr  Height: 5\' 4"  (162.6 cm) Weight: 109 lb (49.442 kg) IBW/kg (Calculated) : 54.7  Temp (24hrs), Avg:97.6 F (36.4 C), Min:97.6 F (36.4 C), Max:97.6 F (36.4 C)   Recent Labs Lab 01/03/16 1914 01/03/16 1949  WBC 35.4*  --   CREATININE 0.61  --   LATICACIDVEN  --  2.3*    Estimated Creatinine Clearance: 75.1 mL/min (by C-G formula based on Cr of 0.61).    No Known Allergies   Thank you for allowing pharmacy to be a part of this patient's care.  Lindsey FrostNathan A Markevion Campos, Pharm.D., BCPS Clinical Pharmacist 01/03/2016 10:58 PM   01/04/2016 0145 additional consult for Zosyn. Zosyn 3.375 gm IV Q8H EI. Lindsey Campos, Pharm.D., BCPS, Clinical Pharmacist

## 2016-01-03 NOTE — ED Notes (Signed)
Came by ems.  Onset n/v/d yesterday.  fsbs 131, 108/77 by ems and zofran 4 mg iv by ems

## 2016-01-03 NOTE — ED Provider Notes (Signed)
[facility] Emergency Department Provider Note   ____________________________________________  Time seen: Approximately 9:37 PM  I have reviewed the triage vital signs and the nursing notes.   HISTORY  Chief Complaint Nausea; Emesis; and Diarrhea   HPI Lindsey Campos is a 40 y.o. female ablation complains of nausea vomiting and diarrhea starting yesterday. Her abdomen is very painful and not crampy just painful. She is having frequent diarrhea and vomiting but no blood in either. She does not think she's running a fever. She has she also has a little bit of chest discomfort but that seemed to come on after he vomited repeatedly. Chest is tender to palpation. Abdomen is also tender. Pain is severe achy N after nausea and vomiting nothing seems to make it better or worse   Past Medical History  Diagnosis Date  . COPD (chronic obstructive pulmonary disease) (HCC)     There are no active problems to display for this patient.   No past surgical history on file.  Current Outpatient Rx  Name  Route  Sig  Dispense  Refill  . albuterol (PROVENTIL HFA;VENTOLIN HFA) 108 (90 Base) MCG/ACT inhaler   Inhalation   Inhale 2 puffs into the lungs every 6 (six) hours as needed for wheezing or shortness of breath.   1 Inhaler   2   . ibuprofen (ADVIL,MOTRIN) 600 MG tablet   Oral   Take 1 tablet (600 mg total) by mouth every 8 (eight) hours as needed for mild pain or moderate pain.   15 tablet   0   . LORazepam (ATIVAN) 0.5 MG tablet   Oral   Take 1 tablet (0.5 mg total) by mouth at bedtime as needed for anxiety.   5 tablet   0   . oxyCODONE-acetaminophen (PERCOCET) 5-325 MG tablet   Oral   Take 1-2 tablets by mouth every 6 (six) hours as needed.   10 tablet   0   . predniSONE (DELTASONE) 20 MG tablet   Oral   Take 2 tablets (40 mg total) by mouth daily.   10 tablet   0   . traMADol (ULTRAM) 50 MG tablet   Oral   Take 1 tablet (50 mg total) by  mouth every 8 (eight) hours as needed (Do not drive or operate machinery while taking as can cause drowsiness.).   10 tablet   0     Allergies Review of patient's allergies indicates no known allergies.  No family history on file.  Social History Social History  Substance Use Topics  . Smoking status: Current Every Day Smoker -- 1.00 packs/day    Types: Cigarettes  . Smokeless tobacco: None  . Alcohol Use: No     Comment: occasionally    Review of Systems Constitutional: No fever/chills Eyes: No visual changes. ENT: No sore throat. Cardiovascular:chest pain. Respiratory: Denies shortness of breath. Gastrointestinal: See history of present illness Genitourinary: Negative for dysuria. Musculoskeletal: Negative for back pain. Skin: Negative for rash. Neurological: Negative for headaches, focal weakness or numbness.  10-point ROS otherwise negative.  ____________________________________________   PHYSICAL EXAM:  VITAL SIGNS: ED Triage Vitals  Enc Vitals Group     BP 01/03/16 1913 101/63 mmHg     Pulse Rate 01/03/16 1913 107     Resp 01/03/16 1913 22     Temp 01/03/16 1913 97.6 F (36.4 C)     Temp Source 01/03/16 1913 Oral     SpO2 01/03/16 1913 100 %  Weight 01/03/16 1913 109 lb (49.442 kg)     Height 01/03/16 1913 5\' 4"  (1.626 m)     Head Cir --      Peak Flow --      Pain Score 01/03/16 1902 8     Pain Loc --      Pain Edu? --      Excl. in GC? --     Constitutional: Alert and oriented. Appears to be in pain. Eyes: Conjunctivae are normal. PERRL. EOMI. Head: Atraumatic. Nose: No congestion/rhinnorhea. Mouth/Throat: Mucous membranes are moist.  Oropharynx non-erythematous. Neck: No stridor.  Cardiovascular: Normal rate, regular rhythm. Grossly normal heart sounds.  Good peripheral circulation. Respiratory: Normal respiratory effort.  No retractions. Lungs CTAB. Gastrointestinal: Firm tender no bowel sounds and this is diffuse fairly marked tender  to palpation and percussion No distention. No abdominal bruits. No CVA tenderness. Musculoskeletal: No lower extremity tenderness nor edema.  No joint effusions. Neurologic:  Normal speech and language. No gross focal neurologic deficits are appreciated. No gait instability. Skin:  Skin is warm, dry and intact. No rash noted. Psychiatric: Mood and affect are normal. Speech and behavior are normal.  GYN: _Patient has a brownish discharge. She does not have any cervical motion tenderness. Patient does not have any adnexal tenderness rectal tenderness or bladder tenderness. ___________________________________________   LABS (all labs ordered are listed, but only abnormal results are displayed)  Labs Reviewed  URINALYSIS COMPLETEWITH MICROSCOPIC ([facility] ONLY) - Abnormal; Notable for the following:    Color, Urine AMBER (*)    APPearance HAZY (*)    Bilirubin Urine 1+ (*)    Ketones, ur 1+ (*)    Protein, ur 100 (*)    Leukocytes, UA 1+ (*)    Squamous Epithelial / LPF 0-5 (*)    All other components within normal limits  CBC WITH DIFFERENTIAL/PLATELET - Abnormal; Notable for the following:    WBC 35.4 (*)    Neutro Abs 34.0 (*)    Lymphs Abs 0.6 (*)    All other components within normal limits  TROPONIN I - Abnormal; Notable for the following:    Troponin I 0.04 (*)    All other components within normal limits  LACTIC ACID, PLASMA - Abnormal; Notable for the following:    Lactic Acid, Venous 2.3 (*)    All other components within normal limits  COMPREHENSIVE METABOLIC PANEL - Abnormal; Notable for the following:    Potassium 3.1 (*)    CO2 20 (*)    Glucose, Bld 129 (*)    All other components within normal limits  C DIFFICILE QUICK SCREEN W PCR REFLEX  GASTROINTESTINAL PANEL BY PCR, STOOL (REPLACES STOOL CULTURE)  CULTURE, BLOOD (ROUTINE X 2)  CULTURE, BLOOD (ROUTINE X 2)  URINE CULTURE  PREGNANCY, URINE  LIPASE, BLOOD  LACTIC ACID, PLASMA  POCT PREGNANCY, URINE    ____________________________________________  EKG  EKG read and interpreted by me as sinus tachycardia at 108 right axis no acute ST-T wave changes ____________________________________________  RADIOLOGY  Chest x-ray read by radiology as no acute disease ____________________________________________   PROCEDURES    Procedures  Critical care time one half hour. This includes evaluating the patient discussing past medical history with the patient calling the hospitalist etc. ____________________________________________   INITIAL IMPRESSION / ASSESSMENT AND PLAN / ED COURSE  Pertinent labs & imaging results that were available during my care of the patient were reviewed by me and considered in my medical decision making (see chart  for details).  Lipase comes back CBC is back code sepsis called ____________________________________________   FINAL CLINICAL IMPRESSION(S) / ED DIAGNOSES  Final diagnoses:  Sepsis, due to unspecified organism [facility](HCC)      NEW MEDICATIONS STARTED DURING THIS VISIT:  New Prescriptions   No medications on file     Note:  This document was prepared using Dragon voice recognition software and may include unintentional dictation errors.    Arnaldo NatalPaul F Lucee Brissett, MD 01/03/16 [phone]

## 2016-01-04 ENCOUNTER — Encounter: Payer: Self-pay | Admitting: Obstetrics and Gynecology

## 2016-01-04 DIAGNOSIS — N739 Female pelvic inflammatory disease, unspecified: Secondary | ICD-10-CM | POA: Diagnosis present

## 2016-01-04 DIAGNOSIS — Z79891 Long term (current) use of opiate analgesic: Secondary | ICD-10-CM | POA: Diagnosis not present

## 2016-01-04 DIAGNOSIS — N390 Urinary tract infection, site not specified: Secondary | ICD-10-CM | POA: Diagnosis present

## 2016-01-04 DIAGNOSIS — N760 Acute vaginitis: Secondary | ICD-10-CM | POA: Diagnosis present

## 2016-01-04 DIAGNOSIS — E860 Dehydration: Secondary | ICD-10-CM | POA: Diagnosis present

## 2016-01-04 DIAGNOSIS — A549 Gonococcal infection, unspecified: Secondary | ICD-10-CM | POA: Diagnosis present

## 2016-01-04 DIAGNOSIS — A419 Sepsis, unspecified organism: Secondary | ICD-10-CM | POA: Diagnosis not present

## 2016-01-04 DIAGNOSIS — F17210 Nicotine dependence, cigarettes, uncomplicated: Secondary | ICD-10-CM | POA: Diagnosis present

## 2016-01-04 DIAGNOSIS — E876 Hypokalemia: Secondary | ICD-10-CM | POA: Diagnosis present

## 2016-01-04 DIAGNOSIS — F419 Anxiety disorder, unspecified: Secondary | ICD-10-CM | POA: Diagnosis present

## 2016-01-04 DIAGNOSIS — Z79899 Other long term (current) drug therapy: Secondary | ICD-10-CM | POA: Diagnosis not present

## 2016-01-04 DIAGNOSIS — R112 Nausea with vomiting, unspecified: Secondary | ICD-10-CM | POA: Diagnosis present

## 2016-01-04 DIAGNOSIS — J449 Chronic obstructive pulmonary disease, unspecified: Secondary | ICD-10-CM

## 2016-01-04 DIAGNOSIS — K529 Noninfective gastroenteritis and colitis, unspecified: Secondary | ICD-10-CM | POA: Diagnosis present

## 2016-01-04 DIAGNOSIS — K5900 Constipation, unspecified: Secondary | ICD-10-CM | POA: Diagnosis present

## 2016-01-04 DIAGNOSIS — Z7952 Long term (current) use of systemic steroids: Secondary | ICD-10-CM | POA: Diagnosis not present

## 2016-01-04 LAB — WET PREP, GENITAL
SPERM: NONE SEEN
TRICH WET PREP: NONE SEEN
Yeast Wet Prep HPF POC: NONE SEEN

## 2016-01-04 LAB — CHLAMYDIA/NGC RT PCR (ARMC ONLY)
CHLAMYDIA TR: NOT DETECTED
N gonorrhoeae: DETECTED — AB

## 2016-01-04 LAB — LACTIC ACID, PLASMA
LACTIC ACID, VENOUS: 1.6 mmol/L (ref 0.5–1.9)
LACTIC ACID, VENOUS: 0.9 mmol/L (ref 0.5–1.9)

## 2016-01-04 LAB — BASIC METABOLIC PANEL
Anion gap: 4 — ABNORMAL LOW (ref 5–15)
BUN: 10 mg/dL (ref 6–20)
CALCIUM: 7.7 mg/dL — AB (ref 8.9–10.3)
CO2: 22 mmol/L (ref 22–32)
Chloride: 111 mmol/L (ref 101–111)
Creatinine, Ser: 0.59 mg/dL (ref 0.44–1.00)
GFR calc Af Amer: >60 mL/min (ref >60)
GLUCOSE: 95 mg/dL (ref 65–99)
Potassium: 3.4 mmol/L — ABNORMAL LOW (ref 3.5–5.1)
SODIUM: 137 mmol/L (ref 135–145)

## 2016-01-04 LAB — CBC
HCT: 33.7 % — ABNORMAL LOW (ref 35.0–47.0)
Hemoglobin: 11.3 g/dL — ABNORMAL LOW (ref 12.0–16.0)
MCH: 32.4 pg (ref 26.0–34.0)
MCHC: 33.5 g/dL (ref 32.0–36.0)
MCV: 96.7 fL (ref 80.0–100.0)
PLATELETS: 154 10*3/uL (ref 150–440)
RBC: 3.48 MIL/uL — ABNORMAL LOW (ref 3.80–5.20)
RDW: 13.8 % (ref 11.5–14.5)
WBC: 30.8 10*3/uL — ABNORMAL HIGH (ref 3.6–11.0)

## 2016-01-04 LAB — MAGNESIUM: MAGNESIUM: 1.6 mg/dL — AB (ref 1.7–2.4)

## 2016-01-04 MED ORDER — VANCOMYCIN HCL IN DEXTROSE 750-5 MG/150ML-% IV SOLN
750.0000 mg | Freq: Two times a day (BID) | INTRAVENOUS | Status: DC
  Administered 2016-01-04: 750 mg via INTRAVENOUS
  Filled 2016-01-04 (×2): qty 150

## 2016-01-04 MED ORDER — ENOXAPARIN SODIUM 40 MG/0.4ML ~~LOC~~ SOLN
40.0000 mg | Freq: Every day | SUBCUTANEOUS | Status: DC
  Administered 2016-01-04 – 2016-01-05 (×3): 40 mg via SUBCUTANEOUS
  Filled 2016-01-04 (×3): qty 0.4

## 2016-01-04 MED ORDER — LORAZEPAM 0.5 MG PO TABS: 0.5000 mg | ORAL_TABLET | Freq: Every evening | ORAL | Status: DC | PRN

## 2016-01-04 MED ORDER — POTASSIUM CHLORIDE 10 MEQ/100ML IV SOLN
10.0000 meq | INTRAVENOUS | Status: DC
  Administered 2016-01-04: 10 meq via INTRAVENOUS
  Filled 2016-01-04 (×4): qty 100

## 2016-01-04 MED ORDER — ONDANSETRON HCL 4 MG/2ML IJ SOLN
4.0000 mg | INTRAMUSCULAR | Status: DC | PRN
  Administered 2016-01-04 (×4): 4 mg via INTRAVENOUS
  Filled 2016-01-04 (×4): qty 2

## 2016-01-04 MED ORDER — POTASSIUM CHLORIDE CRYS ER 20 MEQ PO TBCR
40.0000 meq | EXTENDED_RELEASE_TABLET | Freq: Once | ORAL | Status: AC
  Administered 2016-01-04: 40 meq via ORAL
  Filled 2016-01-04: qty 2

## 2016-01-04 MED ORDER — DOXYCYCLINE HYCLATE 100 MG PO TABS
100.0000 mg | ORAL_TABLET | Freq: Two times a day (BID) | ORAL | Status: DC
  Administered 2016-01-04 – 2016-01-06 (×5): 100 mg via ORAL
  Filled 2016-01-04 (×5): qty 1

## 2016-01-04 MED ORDER — HYDROMORPHONE HCL 1 MG/ML IJ SOLN
0.5000 mg | INTRAMUSCULAR | Status: DC | PRN
  Administered 2016-01-04 – 2016-01-05 (×7): 0.5 mg via INTRAVENOUS
  Filled 2016-01-04 (×7): qty 1

## 2016-01-04 MED ORDER — DEXTROSE 5 % IV SOLN: 2.0000 g | Freq: Four times a day (QID) | INTRAVENOUS | Status: DC

## 2016-01-04 MED ORDER — ENSURE ENLIVE PO LIQD
237.0000 mL | Freq: Three times a day (TID) | ORAL | Status: DC
  Administered 2016-01-05: 237 mL via ORAL

## 2016-01-04 MED ORDER — SODIUM CHLORIDE 0.9 % IV SOLN
INTRAVENOUS | Status: DC
  Administered 2016-01-04 – 2016-01-05 (×2): via INTRAVENOUS
  Administered 2016-01-05 – 2016-01-06 (×2): 100 mL/h via INTRAVENOUS

## 2016-01-04 MED ORDER — POTASSIUM CHLORIDE IN NACL 20-0.9 MEQ/L-% IV SOLN
INTRAVENOUS | Status: DC
Start: 2016-01-04 — End: 2016-01-04
  Administered 2016-01-04: 100 mL/h via INTRAVENOUS
  Filled 2016-01-04 (×3): qty 1000

## 2016-01-04 MED ORDER — DOXYCYCLINE HYCLATE 100 MG PO TABS
100.0000 mg | ORAL_TABLET | Freq: Two times a day (BID) | ORAL | Status: DC
  Administered 2016-01-04 (×2): 100 mg via ORAL
  Filled 2016-01-04 (×2): qty 1

## 2016-01-04 MED ORDER — AZITHROMYCIN 1 G PO PACK
1.0000 g | PACK | Freq: Once | ORAL | Status: AC
  Administered 2016-01-04: 1 g via ORAL
  Filled 2016-01-04: qty 1

## 2016-01-04 MED ORDER — ACETAMINOPHEN 325 MG PO TABS: 650.0000 mg | ORAL_TABLET | Freq: Four times a day (QID) | ORAL | Status: DC | PRN

## 2016-01-04 MED ORDER — ACETAMINOPHEN 650 MG RE SUPP: 650.0000 mg | Freq: Four times a day (QID) | RECTAL | Status: DC | PRN

## 2016-01-04 MED ORDER — CEFTRIAXONE SODIUM 250 MG IJ SOLR
250.0000 mg | Freq: Once | INTRAMUSCULAR | Status: AC
  Administered 2016-01-04: 250 mg via INTRAMUSCULAR
  Filled 2016-01-04: qty 250

## 2016-01-04 MED ORDER — CYCLOBENZAPRINE HCL 10 MG PO TABS
10.0000 mg | ORAL_TABLET | Freq: Three times a day (TID) | ORAL | Status: DC | PRN
  Administered 2016-01-04 – 2016-01-06 (×5): 10 mg via ORAL
  Filled 2016-01-04 (×5): qty 1

## 2016-01-04 MED ORDER — PIPERACILLIN-TAZOBACTAM 3.375 G IVPB
3.3750 g | Freq: Three times a day (TID) | INTRAVENOUS | Status: DC
  Administered 2016-01-04: 3.375 g via INTRAVENOUS
  Filled 2016-01-04 (×3): qty 50

## 2016-01-04 NOTE — Progress Notes (Signed)
Pt c/o nausea and requested if she can have ice chips, prime doc Crosley paged and ordered for Zofran 4mg  Q4hrs PRN and a diet order of NPO except for sips with meds. Will continue to monitor.

## 2016-01-04 NOTE — Progress Notes (Signed)
[facility] [place] [facility]   PATIENT NAME: Lindsey Campos    MR#:  [id number]  DATE OF BIRTH:  1975-07-11  SUBJECTIVE:  CHIEF COMPLAINT:   Chief Complaint  Patient presents with  . Nausea  . Emesis  . Diarrhea   - very emotional, still nauseous, minimal abdominal pain today. -No diarrhea noted. No vaginal discharge. Started on her menstrual period.  REVIEW OF SYSTEMS:  Review of Systems  Constitutional: Negative for fever, chills and malaise/fatigue.  HENT: Negative for ear discharge, ear pain and nosebleeds.   Respiratory: Negative for cough, shortness of breath and wheezing.   Cardiovascular: Negative for chest pain, palpitations and leg swelling.  Gastrointestinal: Positive for nausea and abdominal pain. Negative for vomiting, diarrhea and constipation.  Genitourinary: Negative for dysuria.  Musculoskeletal: Negative for myalgias.  Neurological: Negative for dizziness, speech change, focal weakness, seizures and headaches.  Psychiatric/Behavioral: The patient is nervous/anxious.     DRUG ALLERGIES:  No Known Allergies  VITALS:  Blood pressure 104/54, pulse 87, temperature 98.7 F (37.1 C), temperature source Oral, resp. rate 16, height 5\' 4"  (1.626 m), weight 52.663 kg (116 lb 1.6 oz), last menstrual period 12/13/2015, SpO2 99 %.  PHYSICAL EXAMINATION:  Physical Exam  GENERAL:  40 y.o.-year-old patient lying in the bed with no acute distress.  EYES: Pupils equal, round, reactive to light and accommodation. No scleral icterus. Extraocular muscles intact.  HEENT: Head atraumatic, normocephalic. Oropharynx and nasopharynx clear.  NECK:  Supple, no jugular venous distention. No thyroid enlargement, no tenderness.  LUNGS: Normal breath sounds bilaterally, no wheezing, rales,rhonchi or crepitation. No use of accessory muscles of respiration.  CARDIOVASCULAR: S1, S2 normal. No murmurs, rubs, or gallops.  ABDOMEN: Soft, nontender,Minimal lower  abdominal discomfort on palpation, nondistended. Bowel sounds present. No organomegaly or mass.  EXTREMITIES: No pedal edema, cyanosis, or clubbing.  NEUROLOGIC: Cranial nerves II through XII are intact. Muscle strength 5/5 in all extremities. Sensation intact. Gait not checked.  PSYCHIATRIC: The patient is alert and oriented x 3. Anxious. SKIN: No obvious rash, lesion, or ulcer.    LABORATORY PANEL:   CBC  Recent Labs Lab 01/04/16 0324  WBC 30.8*  HGB 11.3*  HCT 33.7*  PLT 154   ------------------------------------------------------------------------------------------------------------------  Chemistries   Recent Labs Lab 01/03/16 1914 01/04/16 0324  NA 136 137  K 3.1* 3.4*  CL 106 111  CO2 20* 22  GLUCOSE 129* 95  BUN 11 10  CREATININE 0.61 0.59  CALCIUM 9.3 7.7*  MG  --  1.6*  AST 18  --   ALT 16  --   ALKPHOS 75  --   BILITOT 1.0  --    ------------------------------------------------------------------------------------------------------------------  Cardiac Enzymes  Recent Labs Lab 01/03/16 1914  TROPONINI 0.04*   ------------------------------------------------------------------------------------------------------------------  RADIOLOGY:  Ct Abdomen Pelvis W Contrast  01/03/2016  CLINICAL DATA:  Acute onset of generalized abdominal pain, nausea, vomiting and diarrhea. Initial encounter. EXAM: CT ABDOMEN AND PELVIS WITH CONTRAST TECHNIQUE: Multidetector CT imaging of the abdomen and pelvis was performed using the standard protocol following bolus administration of intravenous contrast. CONTRAST:  75mL ISOVUE-300 IOPAMIDOL (ISOVUE-300) INJECTION 61% COMPARISON:  Pelvic ultrasound performed 12/05/2009 FINDINGS: Minimal bibasilar atelectasis is noted. The liver and spleen are unremarkable in appearance. The gallbladder is within normal limits. The pancreas and adrenal glands are unremarkable. The kidneys are unremarkable in appearance. There is no evidence of  hydronephrosis. No renal or ureteral stones are seen. No perinephric stranding is appreciated. No  free fluid is identified. The small bowel is unremarkable in appearance. The stomach is within normal limits. No acute vascular abnormalities are seen. Minimal calcification is noted along the abdominal aorta. The appendix is not well characterized; there is no evidence of appendicitis. The colon is grossly unremarkable in appearance. The bladder is mildly distended. Mild soft tissue inflammation about the bladder could reflect cystitis. Soft tissue inflammation is also noted about the uterus. Would correlate for any evidence of pelvic inflammatory disease. Trace fluid is seen tracking about the adnexa bilaterally. The ovaries are otherwise grossly unremarkable. No inguinal lymphadenopathy is seen. No acute osseous abnormalities are identified. IMPRESSION: 1. Soft tissue inflammation about the uterus. Would correlate for any evidence of pelvic inflammatory disease. Next 2. Mild soft tissue inflammation about the bladder could reflect cystitis. 3. Minimal calcification along the abdominal aorta, somewhat advanced for age. Electronically Signed   By: Roanna RaiderJeffery  Chang M.D.   On: 01/03/2016 22:27   Dg Chest Portable 1 View  01/03/2016  CLINICAL DATA:  Central to right-sided chest pain. Symptoms began yesterday. EXAM: PORTABLE CHEST 1 VIEW COMPARISON:  10/29/2015 FINDINGS: The heart size and mediastinal contours are within normal limits. Both lungs are clear. The visualized skeletal structures are unremarkable. IMPRESSION: No active disease. Electronically Signed   By: Paulina FusiMark  Shogry M.D.   On: 01/03/2016 20:06    EKG:   Orders placed or performed during the hospital encounter of 01/03/16  . ED EKG  . ED EKG  . EKG 12-Lead  . EKG 12-Lead    ASSESSMENT AND PLAN:   40 year old female with past medical history significant for COPD not on home oxygen presents to hospital secondary to nausea and vomiting and abdominal  pain.  #1 Intra-abdominal infection-possible PID versus acute gastroenteritis -Was on vancomycin and Zosyn-discontinued at this time. -Improved symptoms. -Good and oriented tested positive from cervical swab. Given a dose of Rocephin and azithromycin. -White count still elevated. -HIV ordered -OB/GYN consult today. Symptoms are improving. -Started on a liquid diet today. -since WBC still elevated, will continue IV antibiotics for PID- doxycycline and rocephin. -Continue IV fluids, nausea medicine and supportive treatment  #2 Hypokalemia- being replaced  #3 COPD- stable  #4 DVT prophylaxis-on Lovenox    All the records are reviewed and case discussed with Care Management/Social Workerr. Management plans discussed with the patient, family and they are in agreement.  CODE STATUS: Full code  TOTAL TIME TAKING CARE OF THIS PATIENT: 37 minutes.   POSSIBLE D/C IN 1-2 DAYS, DEPENDING ON CLINICAL CONDITION, if symptoms improving.   Enid BaasKALISETTI,Shalamar Plourde M.D on 01/04/2016 at 12:59 PM  Between 7am to 6pm - Pager - [phone]  After 6pm go to [email or web address] - password [email or web address] [facility]  [facility]  Office  [phone]  CC: Primary care physician; [facility]

## 2016-01-04 NOTE — H&P (Signed)
PCP:   [facility]   Chief Complaint:  Abdominal pain  HPI: This is a 40 year old female who states that she developed severe abdominal pain last night. It was generalized, sharp and continuous. She also developed nausea and vomiting and diarrhea also for the past 24 hours. She denies any fever, she denies any burning on urination. She does report chills. She states her abdomen did hurt with walking. She reports she has both protected and unprotected sex with a single partner. She does report some mild vaginal discharge but states this is usual and unchanged. Not foul smelling. She finally came to the ER. In the ER the patient white blood count was 35. She had abdominal tenderness. Her CT abdomen was nonspecific but also question PID. The ER physician did a vaginal examination. He states the cervix was not very erythematous and there was not much cervical motion tenderness.  Review of Systems:  The patient denies anorexia, fever, weight loss,, vision loss, decreased hearing, hoarseness, chest pain, syncope, dyspnea on exertion, peripheral edema, balance deficits, hemoptysis, abdominal pain, melena, hematochezia, severe indigestion/heartburn, hematuria, incontinence, genital sores, muscle weakness, suspicious skin lesions, transient blindness, difficulty walking, depression, unusual weight change, abnormal bleeding, enlarged lymph nodes, angioedema, and breast masses.  Past Medical History: Past Medical History  Diagnosis Date  . COPD (chronic obstructive pulmonary disease) (HCC)    No past surgical history on file.  Medications: Prior to Admission medications   Medication Sig Start Date End Date Taking? Authorizing Provider  albuterol (PROVENTIL HFA;VENTOLIN HFA) 108 (90 Base) MCG/ACT inhaler Inhale 2 puffs into the lungs every 6 (six) hours as needed for wheezing or shortness of breath. 10/29/15  Yes Sharyn CreamerMark Quale, MD  cyclobenzaprine (FLEXERIL) 10 MG tablet Take 10 mg by mouth 3  (three) times daily as needed for muscle spasms.   Yes Historical Provider, MD  ibuprofen (ADVIL,MOTRIN) 600 MG tablet Take 1 tablet (600 mg total) by mouth every 8 (eight) hours as needed for mild pain or moderate pain. 03/05/15  Yes Renford DillsLindsey Miller, NP  LORazepam (ATIVAN) 0.5 MG tablet Take 1 tablet (0.5 mg total) by mouth at bedtime as needed for anxiety. 10/29/15 10/28/16 Yes Sharyn CreamerMark Quale, MD    Allergies:  No Known Allergies  Social History:  reports that she has been smoking Cigarettes.  She has been smoking about 1.00 pack per day. She does not have any smokeless tobacco history on file. She reports that she does not drink alcohol or use illicit drugs.  Family History: Hypertension  Physical Exam: Filed Vitals:   01/03/16 2030 01/03/16 2100 01/03/16 2200 01/03/16 2230  BP: 107/73 91/63 104/73 104/70  Pulse: 104  104 96  Temp:      TempSrc:      Resp: 20 15  24   Height:      Weight:      SpO2: 100%  97% 98%    General:  Alert and oriented times three, well developed and nourished, no acute distress Eyes: PERRLA, pink conjunctiva, no scleral icterus ENT: Moist oral mucosa, neck supple, no thyromegaly Lungs: clear to ascultation, no wheeze, no crackles, no use of accessory muscles Cardiovascular: regular rate and rhythm, no regurgitation, no gallops, no murmurs. No carotid bruits, no JVD Abdomen: soft, positive BS,Positive tenderness to palpation generalized, non-distended, no organomegaly, not an acute abdomen GU: not examined Neuro: CN II - XII grossly intact, sensation intact Musculoskeletal: strength 5/5 all extremities, no clubbing, cyanosis or edema Skin: no rash, no subcutaneous crepitation, no  decubitus Psych: appropriate patient   Labs on Admission:   Recent Labs  01/03/16 1914  NA 136  K 3.1*  CL 106  CO2 20*  GLUCOSE 129*  BUN 11  CREATININE 0.61  CALCIUM 9.3    Recent Labs  01/03/16 1914  AST 18  ALT 16  ALKPHOS 75  BILITOT 1.0  PROT 7.3  ALBUMIN  4.1    Recent Labs  01/03/16 1914  LIPASE 14    Recent Labs  01/03/16 1914  WBC 35.4*  NEUTROABS 34.0*  HGB 13.6  HCT 38.8  MCV 92.9  PLT 198    Recent Labs  01/03/16 1914  TROPONINI 0.04*   Invalid input(s): POCBNP No results for input(s): DDIMER in the last 72 hours. No results for input(s): HGBA1C in the last 72 hours. No results for input(s): CHOL, HDL, LDLCALC, TRIG, CHOLHDL, LDLDIRECT in the last 72 hours. No results for input(s): TSH, T4TOTAL, T3FREE, THYROIDAB in the last 72 hours.  Invalid input(s): FREET3 No results for input(s): VITAMINB12, FOLATE, FERRITIN, TIBC, IRON, RETICCTPCT in the last 72 hours.  Micro Results: No results found for this or any previous visit (from the past 240 hour(s)).   Radiological Exams on Admission: Ct Abdomen Pelvis W Contrast  01/03/2016  CLINICAL DATA:  Acute onset of generalized abdominal pain, nausea, vomiting and diarrhea. Initial encounter. EXAM: CT ABDOMEN AND PELVIS WITH CONTRAST TECHNIQUE: Multidetector CT imaging of the abdomen and pelvis was performed using the standard protocol following bolus administration of intravenous contrast. CONTRAST:  75mL ISOVUE-300 IOPAMIDOL (ISOVUE-300) INJECTION 61% COMPARISON:  Pelvic ultrasound performed 12/05/2009 FINDINGS: Minimal bibasilar atelectasis is noted. The liver and spleen are unremarkable in appearance. The gallbladder is within normal limits. The pancreas and adrenal glands are unremarkable. The kidneys are unremarkable in appearance. There is no evidence of hydronephrosis. No renal or ureteral stones are seen. No perinephric stranding is appreciated. No free fluid is identified. The small bowel is unremarkable in appearance. The stomach is within normal limits. No acute vascular abnormalities are seen. Minimal calcification is noted along the abdominal aorta. The appendix is not well characterized; there is no evidence of appendicitis. The colon is grossly unremarkable in  appearance. The bladder is mildly distended. Mild soft tissue inflammation about the bladder could reflect cystitis. Soft tissue inflammation is also noted about the uterus. Would correlate for any evidence of pelvic inflammatory disease. Trace fluid is seen tracking about the adnexa bilaterally. The ovaries are otherwise grossly unremarkable. No inguinal lymphadenopathy is seen. No acute osseous abnormalities are identified. IMPRESSION: 1. Soft tissue inflammation about the uterus. Would correlate for any evidence of pelvic inflammatory disease. Next 2. Mild soft tissue inflammation about the bladder could reflect cystitis. 3. Minimal calcification along the abdominal aorta, somewhat advanced for age. Electronically Signed   By: Roanna RaiderJeffery  Chang M.D.   On: 01/03/2016 22:27   Dg Chest Portable 1 View  01/03/2016  CLINICAL DATA:  Central to right-sided chest pain. Symptoms began yesterday. EXAM: PORTABLE CHEST 1 VIEW COMPARISON:  10/29/2015 FINDINGS: The heart size and mediastinal contours are within normal limits. Both lungs are clear. The visualized skeletal structures are unremarkable. IMPRESSION: No active disease. Electronically Signed   By: Paulina FusiMark  Shogry M.D.   On: 01/03/2016 20:06    Assessment/Plan Present on Admission:  . Sepsis (HCC) -Admit to MedSurg -Unclear etiology. The patient does have gastroenteritis. We'll order C. difficile toxins -Blood cultures and urine cultures ordered. GC and chlamydia cultures sent and pending -IV antibiotics  Zosyn and vancomycin. We'll add doxycycline orally  . Nausea and vomiting -Likely secondary to above  . possible UTI (lower urinary tract infection) -Patient's UA has a 6-30 white blood count. Await cultures. Patient on IV antibiotics  Tobacco abuse -Nicotine patch and duonebs  Anxiety -Home medications continued   Bridgitt Raggio 01/04/2016, 12:19 AM

## 2016-01-04 NOTE — Consult Note (Signed)
Pharmacy Antibiotic Note  Lindsey Campos is a 40 y.o. female admitted on 01/03/2016 with STI.Pharmacy has been consulted for ceftriaxone dosing.  Patient found to be positive for gonorrhea. Received 2 doses of doxycyline 100mg  PO, now with orders to switch to azithromycin and ceftriaxone.  Plan: Will initiate ceftriaxone 250mg  IM x1. Patient has orders for azithromycin 1g PO x1. This will complete treatment for her STI.  Height: 5\' 4"  (162.6 cm) Weight: 116 lb 1.6 oz (52.663 kg) IBW/kg (Calculated) : 54.7  Temp (24hrs), Avg:98.2 F (36.8 C), Min:97.6 F (36.4 C), Max:98.7 F (37.1 C)   Recent Labs Lab 01/03/16 1914 01/03/16 1949 01/03/16 2319 01/04/16 0324  WBC 35.4*  --   --  30.8*  CREATININE 0.61  --   --  0.59  LATICACIDVEN  --  2.3* 1.6 0.9    Estimated Creatinine Clearance: 80.1 mL/min (by C-G formula based on Cr of 0.59).    No Known Allergies  Antimicrobials this admission: Ceftriaxone IM x1, azithromycin PO x1 Doxycyline 100mg  7/6 >> 7/7 Vanc, Zosyn, Flagyl in ED  Microbiology results: 7/6 BCx x2: NGTD 7/6 UCx: NGTD 7/6 STI screen: + N. gonorrhea  Thank you for allowing pharmacy to be a part of this patient's care.  Roque CashAllison Jannett Schmall, PharmD Clinical Pharmacist 01/04/2016 11:16 AM

## 2016-01-04 NOTE — Progress Notes (Signed)
New Admission Note:   Arrival Method: per bed from ED, pt came from home with daughter Mental Orientation: alert and oriented X4 Telemetry: placed on box [id number], CCMD notified Assessment: Completed Skin: warm, dry, intact, no wounds noted IV: G20 on the left wrist and right AC with transparent dressing, clean and intact Pain: 8/10 scale, sharp pain on the lower abdomen. Will administer PRN pain medicine Safety Measures: Safety Fall Prevention Plan has been given, discussed and signed Admission: Completed 1A Orientation: Patient has been oriented to the room, unit and staff.  Family: daughter at bedside  Orders have been reviewed and implemented. Will continue to monitor the patient. Call light has been placed within reach and bed alarm has been activated.   Janice NorrieAnessa Jehiel Koepp BSN, RN [facility] 1A

## 2016-01-04 NOTE — Progress Notes (Signed)
Initial Nutrition Assessment  DOCUMENTATION CODES:   Not applicable  INTERVENTION:   Await diet advancement as medically able, per order, [facility] Agree with Ensure Enlive po TID, each supplement provides 350 kcal and 20 grams of protein, likes vanilla   NUTRITION DIAGNOSIS:   Inadequate oral intake related to acute illness as evidenced by per patient/family report.  GOAL:   Patient will meet greater than or equal to 90% of their needs  MONITOR:   PO intake, Supplement acceptance, Weight trends, I & O's, Skin  REASON FOR ASSESSMENT:   Malnutrition Screening Tool    ASSESSMENT:   Pt admitted with n/v/d and abdominal pain. Per MD note, pt positive for gonorrhea.  Past Medical History  Diagnosis Date  . COPD (chronic obstructive pulmonary disease) (HCC)     Diet Order:  Diet full liquid Room service appropriate?: Yes; Fluid consistency:: Thin    Pt NPO on visit this morning.   Pt started to have n/v/d day before yesterday. Pt reports appetite was fine, as usual until day before yesterday. Pt reports her daughter in fact, had food poisoning and she thought she did as well, as it was a sudden onset. Pt reports having had Ensure in the past and liking vanilla.   Medications: KCl, NS at 15800mL/hr, Zofran Labs: K 3.4, Magnesium 1.6   Gastrointestinal Profile: Last BM:  01/03/2016   Nutrition-Focused Physical Exam Findings:  Unable to complete Nutrition-Focused physical exam at this time.    Weight Change: Pt reports PTA she weighed herself at work and it was 10lbs down from her usual. Pt reports not knowing that she had lost weight or when it had happened.   Skin:  Reviewed, no issues   Height:   Ht Readings from Last 1 Encounters:  01/04/16 5\' 4"  (1.626 m)    Weight:   Wt Readings from Last 1 Encounters:  01/04/16 116 lb 1.6 oz (52.663 kg)   Wt Readings from Last 10 Encounters:  01/04/16 116 lb 1.6 oz (52.663 kg)  12/05/15 112 lb (50.803  kg)  10/29/15 117 lb (53.071 kg)  03/05/15 106 lb (48.081 kg)     BMI:  Body mass index is 19.92 kg/(m^2).  Estimated Nutritional Needs:   Kcal:  1590-1855kcals  Protein:  58-68g protein  Fluid:  >/= 1.6L fluid  EDUCATION NEEDS:   Education needs no appropriate at this time  Leda QuailAllyson Elanda Garmany, RD, LDN Pager [phone] Weekend/On-Call Pager [phone]

## 2016-01-04 NOTE — Consult Note (Signed)
GYNECOLOGY CONSULT HISTORY AND PHYSICAL  Reason for Consult:Suspected PID Referring Physician: Gladstone Lighter, MD   Lindsey Campos is an 40 y.o. [id number] female who presented to the Emergency Room with complaints of nausea/vomiting, diarrhea, and abdominal pain. X 1 day.  Notes last meal was pizza.  Denied any recent sick contacts, no one else in home exhibiting symptoms.  Was admitted due to significantly elevated white count, gastroenteritis, and non-specific abdominal pain.  Has been initiated on broad spectrum antibiotics.  Awaiting C. Difficile toxins, blood and urine cultures.   Today patient notes that her pain has decreased some. Does note that her appetite has returned. Denies any further episodes of vomiting, but still notes some nausea.  Denies further episodes of diarrhea. Continues to deny urinary complaints or abnormal vaginal discharge.  Does report that her menstrual cycle started today.  Denies fevers or chills.    Pertinent Gynecological History: Menses: regular every 28 days without intermenstrual spotting Contraception: condoms (however not consistently) Blood transfusions: none Sexually transmitted diseases: no past history Previous GYN Procedures: D&C (3 or 4 performed for terminations) Last pap: normal Date: 2016. OB History: G10 P3164    Menstrual History: Menarche age: 55 Patient's last menstrual period was 12/13/2015.     Obstetric History   G10  P4   T3   P1   A6   TAB4   SAB2   E0   M0   L4     # Outcome Date GA Lbr Len/2nd Weight Sex Delivery Anes PTL Lv  10 TAB           9 TAB           8 TAB           7 TAB           6 SAB           5 SAB           4 Preterm  [redacted]w[redacted]d   Vag-Spont     3 Term      Vag-Spont     2 Term      Vag-Spont     1 Term      Vag-Spont         Past Medical History  Diagnosis Date  . COPD (chronic obstructive pulmonary disease) (HCC)     No past surgical history on file.  No family history on file.  Social  History:  reports that she has been smoking Cigarettes.  She has been smoking about 1.00 pack per day. She does not have any smokeless tobacco history on file. She reports that she does not drink alcohol or use illicit drugs.  Allergies: No Known Allergies  Medications:  Prior to Admission:  Prescriptions prior to admission  Medication Sig Dispense Refill Last Dose  . albuterol (PROVENTIL HFA;VENTOLIN HFA) 108 (90 Base) MCG/ACT inhaler Inhale 2 puffs into the lungs every 6 (six) hours as needed for wheezing or shortness of breath. 1 Inhaler 2 prn at prn  . cyclobenzaprine (FLEXERIL) 10 MG tablet Take 10 mg by mouth 3 (three) times daily as needed for muscle spasms.   prn at prn  . ibuprofen (ADVIL,MOTRIN) 600 MG tablet Take 1 tablet (600 mg total) by mouth every 8 (eight) hours as needed for mild pain or moderate pain. 15 tablet 0 prn at prn  . LORazepam (ATIVAN) 0.5 MG tablet Take 1 tablet (0.5 mg total) by mouth at bedtime as needed for anxiety.  5 tablet 0 prn at prn    Review of Systems  Constitutional: Negative for fever, chills and malaise/fatigue.  HENT: Negative for sore throat.   Eyes: Negative.   Respiratory: Negative for cough, shortness of breath and wheezing.   Cardiovascular: Negative for chest pain, palpitations and leg swelling.  Gastrointestinal: Positive for nausea and abdominal pain. Negative for vomiting, diarrhea and constipation.  Genitourinary: Negative for dysuria, urgency, frequency, hematuria and flank pain.       Positive for vaginal bleeding (onset of menses today)  Musculoskeletal: Negative.   Skin: Negative for itching and rash.  Neurological: Negative for dizziness, sensory change and headaches.  Endo/Heme/Allergies: Negative.   Psychiatric/Behavioral: Negative.     Blood pressure 104/54, pulse 87, temperature 98.7 F (37.1 C), temperature source Oral, resp. rate 16, height 5' 4"  (1.626 m), weight 116 lb 1.6 oz (52.663 kg), last menstrual period  12/13/2015, SpO2 99 %. Physical Exam  Constitutional: She is oriented to person, place, and time. She appears well-developed and well-nourished. No distress.  HENT:  Head: Normocephalic and atraumatic.  Mouth/Throat: Oropharynx is clear and moist. No oropharyngeal exudate.  Eyes: Conjunctivae and EOM are normal. Pupils are equal, round, and reactive to light. No scleral icterus.  Neck: Normal range of motion. Neck supple. No JVD present. No tracheal deviation present. No thyromegaly present.  Cardiovascular: Normal rate, regular rhythm and normal heart sounds.  Exam reveals no gallop and no friction rub.   No murmur heard. Respiratory: Effort normal and breath sounds normal. No respiratory distress. She has no wheezes. She has no rales.  GI: Soft. Bowel sounds are normal. She exhibits no distension and no mass. There is tenderness. There is no rebound and no guarding.  Mild tenderness in generalized lower abdomen, more so in suprapubic region  Genitourinary:  External genitalia normal.  Internal exam obscured by blood but mostly appears normal.  Bimanual exam with very little motion tenderness.  No adnexal tenderness or masses palpable.   Musculoskeletal: Normal range of motion. She exhibits no edema or tenderness.  Neurological: She is alert and oriented to person, place, and time.  Skin: Skin is warm and dry. She is not diaphoretic.  Psychiatric: She has a normal mood and affect. Her behavior is normal.    Results for orders placed or performed during the hospital encounter of 01/03/16 (from the past 48 hour(s))  CBC with Differential     Status: Abnormal   Collection Time: 01/03/16  7:14 PM  Result Value Ref Range   WBC 35.4 (H) 3.6 - 11.0 K/uL   RBC 4.18 3.80 - 5.20 MIL/uL   Hemoglobin 13.6 12.0 - 16.0 g/dL   HCT 38.8 35.0 - 47.0 %   MCV 92.9 80.0 - 100.0 fL   MCH 32.6 26.0 - 34.0 pg   MCHC 35.1 32.0 - 36.0 g/dL   RDW 13.2 11.5 - 14.5 %   Platelets 198 150 - 440 K/uL    Neutrophils Relative % 96 %   Neutro Abs 34.0 (H) 1.4 - 6.5 K/uL   Lymphocytes Relative 2 %   Lymphs Abs 0.6 (L) 1.0 - 3.6 K/uL   Monocytes Relative 2 %   Monocytes Absolute 0.8 0.2 - 0.9 K/uL   Eosinophils Relative 0 %   Eosinophils Absolute 0.0 0 - 0.7 K/uL   Basophils Relative 0 %   Basophils Absolute 0.0 0 - 0.1 K/uL  Troponin I     Status: Abnormal   Collection Time: 01/03/16  7:14 PM  Result Value Ref Range   Troponin I 0.04 (HH) <0.03 ng/mL    Comment: CRITICAL RESULT CALLED TO, READ BACK BY AND VERIFIED WITH  VANESSA ASHLEY AT 2129 01/03/16 SDR   Comprehensive metabolic panel     Status: Abnormal   Collection Time: 01/03/16  7:14 PM  Result Value Ref Range   Sodium 136 135 - 145 mmol/L   Potassium 3.1 (L) 3.5 - 5.1 mmol/L   Chloride 106 101 - 111 mmol/L   CO2 20 (L) 22 - 32 mmol/L   Glucose, Bld 129 (H) 65 - 99 mg/dL   BUN 11 6 - 20 mg/dL   Creatinine, Ser 0.61 0.44 - 1.00 mg/dL   Calcium 9.3 8.9 - 10.3 mg/dL   Total Protein 7.3 6.5 - 8.1 g/dL   Albumin 4.1 3.5 - 5.0 g/dL   AST 18 15 - 41 U/L   ALT 16 14 - 54 U/L   Alkaline Phosphatase 75 38 - 126 U/L   Total Bilirubin 1.0 0.3 - 1.2 mg/dL   GFR calc non Af Amer >60 >60 mL/min   GFR calc Af Amer >60 >60 mL/min    Comment: (NOTE) The eGFR has been calculated using the CKD EPI equation. This calculation has not been validated in all clinical situations. eGFR's persistently <60 mL/min signify possible Chronic Kidney Disease.    Anion gap 10 5 - 15  Lipase, blood     Status: None   Collection Time: 01/03/16  7:14 PM  Result Value Ref Range   Lipase 14 11 - 51 U/L  Lactic acid, plasma     Status: Abnormal   Collection Time: 01/03/16  7:49 PM  Result Value Ref Range   Lactic Acid, Venous 2.3 (HH) 0.5 - 1.9 mmol/L    Comment: CRITICAL RESULT CALLED TO, READ BACK BY AND VERIFIED WITH  VANESSA ASHLEY AT 2037 01/03/16 SDR   Urinalysis complete, with microscopic     Status: Abnormal   Collection Time: 01/03/16  7:54 PM   Result Value Ref Range   Color, Urine AMBER (A) YELLOW   APPearance HAZY (A) CLEAR   Glucose, UA NEGATIVE NEGATIVE mg/dL   Bilirubin Urine 1+ (A) NEGATIVE   Ketones, ur 1+ (A) NEGATIVE mg/dL   Specific Gravity, Urine 1.029 1.005 - 1.030   Hgb urine dipstick NEGATIVE NEGATIVE   pH 5.0 5.0 - 8.0   Protein, ur 100 (A) NEGATIVE mg/dL   Nitrite NEGATIVE NEGATIVE   Leukocytes, UA 1+ (A) NEGATIVE   RBC / HPF 0-5 0 - 5 RBC/hpf   WBC, UA 6-30 0 - 5 WBC/hpf   Bacteria, UA NONE SEEN NONE SEEN   Squamous Epithelial / LPF 0-5 (A) NONE SEEN   Mucous PRESENT   Pregnancy, urine     Status: None   Collection Time: 01/03/16  7:54 PM  Result Value Ref Range   Preg Test, Ur NEGATIVE NEGATIVE  Pregnancy, urine POC     Status: None   Collection Time: 01/03/16  8:05 PM  Result Value Ref Range   Preg Test, Ur NEGATIVE NEGATIVE    Comment:        THE SENSITIVITY OF THIS METHODOLOGY IS >24 mIU/mL   Blood Culture (routine x 2)     Status: None (Preliminary result)   Collection Time: 01/03/16 10:02 PM  Result Value Ref Range   Specimen Description BLOOD RIGHT ASSIST CONTROL    Special Requests      BOTTLES DRAWN AEROBIC AND ANAEROBIC [place]  Culture NO GROWTH < 12 HOURS    Report Status PENDING   Blood Culture (routine x 2)     Status: None (Preliminary result)   Collection Time: 01/03/16 10:02 PM  Result Value Ref Range   Specimen Description BLOOD LEFT WRIST    Special Requests BOTTLES DRAWN AEROBIC AND ANAEROBIC [id number]    Culture NO GROWTH < 12 HOURS    Report Status PENDING   Lactic acid, plasma     Status: None   Collection Time: 01/03/16 11:19 PM  Result Value Ref Range   Lactic Acid, Venous 1.6 0.5 - 1.9 mmol/L  Chlamydia/NGC rt PCR ([facility] only)     Status: Abnormal   Collection Time: 01/03/16 11:24 PM  Result Value Ref Range   Specimen source GC/Chlam ENDOCERVICAL    Chlamydia Tr NOT DETECTED NOT DETECTED   N gonorrhoeae DETECTED (A) NOT DETECTED    Comment:  (NOTE) 100  This methodology has not been evaluated in pregnant women or in 200  patients with a history of hysterectomy. 300 400  This methodology will not be performed on patients less than 58  years of age.   Wet prep, genital     Status: Abnormal   Collection Time: 01/04/16 12:34 AM  Result Value Ref Range   Yeast Wet Prep HPF POC NONE SEEN NONE SEEN   Trich, Wet Prep NONE SEEN NONE SEEN   Clue Cells Wet Prep HPF POC PRESENT (A) NONE SEEN   WBC, Wet Prep HPF POC MANY (A) NONE SEEN   Sperm NONE SEEN   Lactic acid, plasma     Status: None   Collection Time: 01/04/16  3:24 AM  Result Value Ref Range   Lactic Acid, Venous 0.9 0.5 - 1.9 mmol/L  Basic metabolic panel     Status: Abnormal   Collection Time: 01/04/16  3:24 AM  Result Value Ref Range   Sodium 137 135 - 145 mmol/L   Potassium 3.4 (L) 3.5 - 5.1 mmol/L   Chloride 111 101 - 111 mmol/L   CO2 22 22 - 32 mmol/L   Glucose, Bld 95 65 - 99 mg/dL   BUN 10 6 - 20 mg/dL   Creatinine, Ser 0.59 0.44 - 1.00 mg/dL   Calcium 7.7 (L) 8.9 - 10.3 mg/dL   GFR calc non Af Amer >60 >60 mL/min   GFR calc Af Amer >60 >60 mL/min    Comment: (NOTE) The eGFR has been calculated using the CKD EPI equation. This calculation has not been validated in all clinical situations. eGFR's persistently <60 mL/min signify possible Chronic Kidney Disease.    Anion gap 4 (L) 5 - 15  CBC     Status: Abnormal   Collection Time: 01/04/16  3:24 AM  Result Value Ref Range   WBC 30.8 (H) 3.6 - 11.0 K/uL   RBC 3.48 (L) 3.80 - 5.20 MIL/uL   Hemoglobin 11.3 (L) 12.0 - 16.0 g/dL   HCT 33.7 (L) 35.0 - 47.0 %   MCV 96.7 80.0 - 100.0 fL   MCH 32.4 26.0 - 34.0 pg   MCHC 33.5 32.0 - 36.0 g/dL   RDW 13.8 11.5 - 14.5 %   Platelets 154 150 - 440 K/uL  Magnesium     Status: Abnormal   Collection Time: 01/04/16  3:24 AM  Result Value Ref Range   Magnesium 1.6 (L) 1.7 - 2.4 mg/dL    Ct Abdomen Pelvis W Contrast  01/03/2016  CLINICAL DATA:  Acute onset of  generalized abdominal pain, nausea, vomiting and diarrhea. Initial encounter. EXAM: CT ABDOMEN AND PELVIS WITH CONTRAST TECHNIQUE: Multidetector CT imaging of the abdomen and pelvis was performed using the standard protocol following bolus administration of intravenous contrast. CONTRAST:  2m ISOVUE-300 IOPAMIDOL (ISOVUE-300) INJECTION 61% COMPARISON:  Pelvic ultrasound performed 12/05/2009 FINDINGS: Minimal bibasilar atelectasis is noted. The liver and spleen are unremarkable in appearance. The gallbladder is within normal limits. The pancreas and adrenal glands are unremarkable. The kidneys are unremarkable in appearance. There is no evidence of hydronephrosis. No renal or ureteral stones are seen. No perinephric stranding is appreciated. No free fluid is identified. The small bowel is unremarkable in appearance. The stomach is within normal limits. No acute vascular abnormalities are seen. Minimal calcification is noted along the abdominal aorta. The appendix is not well characterized; there is no evidence of appendicitis. The colon is grossly unremarkable in appearance. The bladder is mildly distended. Mild soft tissue inflammation about the bladder could reflect cystitis. Soft tissue inflammation is also noted about the uterus. Would correlate for any evidence of pelvic inflammatory disease. Trace fluid is seen tracking about the adnexa bilaterally. The ovaries are otherwise grossly unremarkable. No inguinal lymphadenopathy is seen. No acute osseous abnormalities are identified. IMPRESSION: 1. Soft tissue inflammation about the uterus. Would correlate for any evidence of pelvic inflammatory disease. Next 2. Mild soft tissue inflammation about the bladder could reflect cystitis. 3. Minimal calcification along the abdominal aorta, somewhat advanced for age. Electronically Signed   By: JGarald BaldingM.D.   On: 01/03/2016 22:27   Dg Chest Portable 1 View  01/03/2016  CLINICAL DATA:  Central to right-sided  chest pain. Symptoms began yesterday. EXAM: PORTABLE CHEST 1 VIEW COMPARISON:  10/29/2015 FINDINGS: The heart size and mediastinal contours are within normal limits. Both lungs are clear. The visualized skeletal structures are unremarkable. IMPRESSION: No active disease. Electronically Signed   By: MNelson ChimesM.D.   On: 01/03/2016 20:06    Assessment/Plan: 1. Intra-abdominal infection - Differential includes PID vs gastroenteritis.  Also lower on differential is UTI.  - Patient with positive Gonorrhea infection and Bacterial Vaginosis noted on recent labs.  Recommend treatment with doxycyline 100 mg q 12 hrs (can be IV dose, or if tolerated can be PO) and Rocephin 250 mg IM x 1 dose.   Patient currently receiving Flagyl IV (which can help to treat BV infection).  Once IV antibiotics discontinued, will need to continue treatment with 500 mg BID of Flagyl x total of 7 days PO, and Doxycyline 100 mg BID for total of 14 days.   Overall, patient notes that her symptoms are improving since admission.  - Still pending urine, blood, and C. Difficile toxin culture results.  - Would recommend full STD workup, including HIV, RPR, and Hep B. I can place these orders.  - Can continue  - Advance diet as tolerated.  Can continue IVF, anti-emetics as needed.  - Continue to monitor WBC count for downward trend.  2. COPD - stable    Thank you for the consult. Will discontinue follow up at this time.  If patient's clinical picture does not improve, please do not hesitate to reconsult. If any labs that I have ordered return positive, I will return to discuss with patient.  Patient should follow up in the office once discharged in 2-3 weeks to repeat cultures to ensure adequate treatment of STD..Marland Kitchen    ARubie Maid7/12/2015

## 2016-01-04 NOTE — Plan of Care (Signed)
Per patient, she has had loose BMs for several days prior to admit - but no BMs here in hosp yet.  Pt on Enteric just as precaution and will send sample if becomes available.

## 2016-01-05 LAB — URINE CULTURE: Culture: NO GROWTH

## 2016-01-05 LAB — CBC
HEMATOCRIT: 31.1 % — AB (ref 35.0–47.0)
HEMOGLOBIN: 10.7 g/dL — AB (ref 12.0–16.0)
MCH: 33 pg (ref 26.0–34.0)
MCHC: 34.3 g/dL (ref 32.0–36.0)
MCV: 96.2 fL (ref 80.0–100.0)
Platelets: 156 10*3/uL (ref 150–440)
RBC: 3.23 MIL/uL — AB (ref 3.80–5.20)
RDW: 13.9 % (ref 11.5–14.5)
WBC: 15.2 10*3/uL — AB (ref 3.6–11.0)

## 2016-01-05 LAB — BASIC METABOLIC PANEL
ANION GAP: 4 — AB (ref 5–15)
BUN: 9 mg/dL (ref 6–20)
CHLORIDE: 112 mmol/L — AB (ref 101–111)
CO2: 21 mmol/L — AB (ref 22–32)
Calcium: 8.1 mg/dL — ABNORMAL LOW (ref 8.9–10.3)
Creatinine, Ser: 0.61 mg/dL (ref 0.44–1.00)
GFR calc Af Amer: >60 mL/min (ref >60)
GFR calc non Af Amer: >60 mL/min (ref >60)
GLUCOSE: 74 mg/dL (ref 65–99)
POTASSIUM: 3.7 mmol/L (ref 3.5–5.1)
Sodium: 137 mmol/L (ref 135–145)

## 2016-01-05 LAB — HIV ANTIBODY (ROUTINE TESTING W REFLEX): HIV Screen 4th Generation wRfx: NONREACTIVE

## 2016-01-05 MED ORDER — OXYCODONE HCL 5 MG PO TABS
10.0000 mg | ORAL_TABLET | ORAL | Status: DC | PRN
  Administered 2016-01-05 – 2016-01-06 (×4): 10 mg via ORAL
  Filled 2016-01-05 (×4): qty 2

## 2016-01-05 MED ORDER — METRONIDAZOLE IN NACL 5-0.79 MG/ML-% IV SOLN
500.0000 mg | Freq: Three times a day (TID) | INTRAVENOUS | Status: DC
  Administered 2016-01-05 – 2016-01-06 (×4): 500 mg via INTRAVENOUS
  Filled 2016-01-05 (×6): qty 100

## 2016-01-05 MED ORDER — SIMETHICONE 80 MG PO CHEW
80.0000 mg | CHEWABLE_TABLET | Freq: Four times a day (QID) | ORAL | Status: DC | PRN
  Administered 2016-01-05: 80 mg via ORAL
  Filled 2016-01-05: qty 1

## 2016-01-05 MED ORDER — POLYETHYLENE GLYCOL 3350 17 G PO PACK
17.0000 g | PACK | Freq: Every day | ORAL | Status: DC
  Administered 2016-01-05 – 2016-01-06 (×2): 17 g via ORAL
  Filled 2016-01-05 (×2): qty 1

## 2016-01-05 MED ORDER — OXYCODONE HCL 5 MG PO TABS: 5.0000 mg | ORAL_TABLET | ORAL | Status: DC | PRN

## 2016-01-05 NOTE — Progress Notes (Signed)
Patient is A&O x4, up with SBA. IV fluids running, started a new IV ABX, tolerated well. Gave PRNs through morning part of shift. The second half of the shift patient slept and had company. Was able to tolerated pizza and other food brought in by family without N&V.

## 2016-01-05 NOTE — Progress Notes (Signed)
Pt c/o abdominal discomfort and gas pain. Pt doesn't want to take pain medicine at this time. Prime doc Pyreddy paged and ordered for Simethicone PO PRN for flatulence. Will administer and continue to monitor.

## 2016-01-05 NOTE — Progress Notes (Signed)
Patient ID: Lindsey Campos, female   DOB: 1975-08-29, 40 y.o.   MRN: [id number] [facility] PROGRESS NOTE  Lindsey Campos YQM:[id number] DOB: 1975-08-29 DOA: 01/03/2016 [facility]: [facility]  HPI/Subjective: Patient still having abdominal pain up to 8 out of 10 intensity. Has not had a bowel movement yet. Very nauseous and unable to eat.  Objective: Filed Vitals:   01/05/16 0506 01/05/16 0737  BP: 120/75 100/58  Pulse: 93 91  Temp: 98.6 F (37 C) 99.6 F (37.6 C)  Resp: 18 16    Filed Weights   01/03/16 1913 01/04/16 0144  Weight: 49.442 kg (109 lb) 52.663 kg (116 lb 1.6 oz)    ROS: Review of Systems  Constitutional: Negative for fever and chills.  Eyes: Negative for blurred vision.  Respiratory: Negative for cough and shortness of breath.   Cardiovascular: Negative for chest pain.  Gastrointestinal: Positive for nausea, abdominal pain and constipation. Negative for vomiting and diarrhea.  Genitourinary: Negative for dysuria.  Musculoskeletal: Negative for joint pain.  Neurological: Negative for dizziness and headaches.   Exam: Physical Exam  Constitutional: She is oriented to person, place, and time.  HENT:  Nose: No mucosal edema.  Mouth/Throat: No oropharyngeal exudate or posterior oropharyngeal edema.  Eyes: Conjunctivae, EOM and lids are normal. Pupils are equal, round, and reactive to light.  Neck: No JVD present. Carotid bruit is not present. No edema present. No thyroid mass and no thyromegaly present.  Cardiovascular: S1 normal and S2 normal.  Exam reveals no gallop.   No murmur heard. Pulses:      Dorsalis pedis pulses are 2+ on the right side, and 2+ on the left side.  Respiratory: No respiratory distress. She has no wheezes. She has no rhonchi. She has no rales.  GI: Soft. Bowel sounds are normal. There is no tenderness.  Musculoskeletal:       Right ankle: She exhibits no swelling.       Left ankle: She exhibits no swelling.  Lymphadenopathy:     She has no cervical adenopathy.  Neurological: She is alert and oriented to person, place, and time. No cranial nerve deficit.  Skin: Skin is warm. No rash noted. Nails show no clubbing.  Psychiatric: She has a normal mood and affect.      Data Reviewed: Basic Metabolic Panel:  Recent Labs Lab 01/03/16 1914 01/04/16 0324 01/05/16 0451  NA 136 137 137  K 3.1* 3.4* 3.7  CL 106 111 112*  CO2 20* 22 21*  GLUCOSE 129* 95 74  BUN 11 10 9   CREATININE 0.61 0.59 0.61  CALCIUM 9.3 7.7* 8.1*  MG  --  1.6*  --    Liver Function Tests:  Recent Labs Lab 01/03/16 1914  AST 18  ALT 16  ALKPHOS 75  BILITOT 1.0  PROT 7.3  ALBUMIN 4.1    Recent Labs Lab 01/03/16 1914  LIPASE 14   CBC:  Recent Labs Lab 01/03/16 1914 01/04/16 0324 01/05/16 0451  WBC 35.4* 30.8* 15.2*  NEUTROABS 34.0*  --   --   HGB 13.6 11.3* 10.7*  HCT 38.8 33.7* 31.1*  MCV 92.9 96.7 96.2  PLT 198 154 156   Cardiac Enzymes:  Recent Labs Lab 01/03/16 1914  TROPONINI 0.04*     Recent Results (from the past 240 hour(s))  Urine culture     Status: None   Collection Time: 01/03/16  7:54 PM  Result Value Ref Range Status   Specimen Description URINE, RANDOM  Final   Special Requests NONE  Final   Culture NO GROWTH Performed at [facility]   Final   Report Status 01/05/2016 FINAL  Final  Blood Culture (routine x 2)     Status: None (Preliminary result)   Collection Time: 01/03/16 10:02 PM  Result Value Ref Range Status   Specimen Description BLOOD RIGHT ASSIST CONTROL  Final   Special Requests   Final    BOTTLES DRAWN AEROBIC AND ANAEROBIC 10CCAERO,15CCANA   Culture NO GROWTH 2 DAYS  Final   Report Status PENDING  Incomplete  Blood Culture (routine x 2)     Status: None (Preliminary result)   Collection Time: 01/03/16 10:02 PM  Result Value Ref Range Status   Specimen Description BLOOD LEFT WRIST  Final   Special Requests BOTTLES DRAWN AEROBIC AND ANAEROBIC 5CCAERO,5CCANA  Final    Culture NO GROWTH 2 DAYS  Final   Report Status PENDING  Incomplete  Chlamydia/NGC rt PCR ([facility] only)     Status: Abnormal   Collection Time: 01/03/16 11:24 PM  Result Value Ref Range Status   Specimen source GC/Chlam ENDOCERVICAL  Final   Chlamydia Tr NOT DETECTED NOT DETECTED Final   N gonorrhoeae DETECTED (A) NOT DETECTED Final    Comment: (NOTE) 100  This methodology has not been evaluated in pregnant women or in 200  patients with a history of hysterectomy. 300 400  This methodology will not be performed on patients less than 18  years of age.   Wet prep, genital     Status: Abnormal   Collection Time: 01/04/16 12:34 AM  Result Value Ref Range Status   Yeast Wet Prep HPF POC NONE SEEN NONE SEEN Final   Trich, Wet Prep NONE SEEN NONE SEEN Final   Clue Cells Wet Prep HPF POC PRESENT (A) NONE SEEN Final   WBC, Wet Prep HPF POC MANY (A) NONE SEEN Final   Sperm NONE SEEN  Final     Studies: Ct Abdomen Pelvis W Contrast  01/03/2016  CLINICAL DATA:  Acute onset of generalized abdominal pain, nausea, vomiting and diarrhea. Initial encounter. EXAM: CT ABDOMEN AND PELVIS WITH CONTRAST TECHNIQUE: Multidetector CT imaging of the abdomen and pelvis was performed using the standard protocol following bolus administration of intravenous contrast. CONTRAST:  75mL ISOVUE-300 IOPAMIDOL (ISOVUE-300) INJECTION 61% COMPARISON:  Pelvic ultrasound performed 12/05/2009 FINDINGS: Minimal bibasilar atelectasis is noted. The liver and spleen are unremarkable in appearance. The gallbladder is within normal limits. The pancreas and adrenal glands are unremarkable. The kidneys are unremarkable in appearance. There is no evidence of hydronephrosis. No renal or ureteral stones are seen. No perinephric stranding is appreciated. No free fluid is identified. The small bowel is unremarkable in appearance. The stomach is within normal limits. No acute vascular abnormalities are seen. Minimal calcification is noted  along the abdominal aorta. The appendix is not well characterized; there is no evidence of appendicitis. The colon is grossly unremarkable in appearance. The bladder is mildly distended. Mild soft tissue inflammation about the bladder could reflect cystitis. Soft tissue inflammation is also noted about the uterus. Would correlate for any evidence of pelvic inflammatory disease. Trace fluid is seen tracking about the adnexa bilaterally. The ovaries are otherwise grossly unremarkable. No inguinal lymphadenopathy is seen. No acute osseous abnormalities are identified. IMPRESSION: 1. Soft tissue inflammation about the uterus. Would correlate for any evidence of pelvic inflammatory disease. Next 2. Mild soft tissue inflammation about the bladder could reflect cystitis. 3. Minimal calcification  along the abdominal aorta, somewhat advanced for age. Electronically Signed   By: Roanna Raider M.D.   On: 01/03/2016 22:27   Dg Chest Portable 1 View  01/03/2016  CLINICAL DATA:  Central to right-sided chest pain. Symptoms began yesterday. EXAM: PORTABLE CHEST 1 VIEW COMPARISON:  10/29/2015 FINDINGS: The heart size and mediastinal contours are within normal limits. Both lungs are clear. The visualized skeletal structures are unremarkable. IMPRESSION: No active disease. Electronically Signed   By: Paulina Fusi M.D.   On: 01/03/2016 20:06    Scheduled Meds: . doxycycline  100 mg Oral Q12H  . enoxaparin (LOVENOX) injection  40 mg Subcutaneous QHS  . feeding supplement (ENSURE ENLIVE)  237 mL Oral TID WC  . metronidazole  500 mg Intravenous Q8H  . polyethylene glycol  17 g Oral Daily   Continuous Infusions: . sodium chloride 100 mL/hr (01/05/16 0209)    Assessment/Plan:  1. Abdominal pain, PID. Positive gonorrhea on swab. Patient given IM Rocephin and is on doxycycline at this point. Since the patient is not eating and still nauseous I will continue IV fluid hydration at this point. HIV test negative. I advised the  patient that her partner needs to be tested and treated. 2. Bacterial vaginosis. Start IV Flagyl. Switch over to oral upon discharge. 3. Constipation will start MiraLAX 4. History of COPD. Respiratory status stable.  Code Status:     Code Status Orders        Start     Ordered   01/04/16 0141  Full code   Continuous     01/04/16 0140    Code Status History    Date Active Date Inactive Code Status Order ID Comments User Context   This patient has a current code status but no historical code status.     Family Communication: I did not speak to any family at patient request Disposition Plan: Will evaluate on a daily basis to see if I can discharge home  Consultants:  Gynecology  Antibiotics: Doxycycline and Flagyl  Time spent: 24 minutes  Alford Highland  [employer]

## 2016-01-06 LAB — HIV ANTIBODY (ROUTINE TESTING W REFLEX): HIV Screen 4th Generation wRfx: NONREACTIVE

## 2016-01-06 LAB — BASIC METABOLIC PANEL
ANION GAP: 4 — AB (ref 5–15)
BUN: 5 mg/dL — ABNORMAL LOW (ref 6–20)
CO2: 23 mmol/L (ref 22–32)
Calcium: 7.8 mg/dL — ABNORMAL LOW (ref 8.9–10.3)
Chloride: 111 mmol/L (ref 101–111)
Creatinine, Ser: 0.48 mg/dL (ref 0.44–1.00)
GFR calc Af Amer: >60 mL/min (ref >60)
GLUCOSE: 95 mg/dL (ref 65–99)
POTASSIUM: 3.1 mmol/L — AB (ref 3.5–5.1)
Sodium: 138 mmol/L (ref 135–145)

## 2016-01-06 LAB — CBC
HEMATOCRIT: 30 % — AB (ref 35.0–47.0)
HEMOGLOBIN: 10.3 g/dL — AB (ref 12.0–16.0)
MCH: 32.9 pg (ref 26.0–34.0)
MCHC: 34.5 g/dL (ref 32.0–36.0)
MCV: 95.3 fL (ref 80.0–100.0)
Platelets: 187 10*3/uL (ref 150–440)
RBC: 3.14 MIL/uL — AB (ref 3.80–5.20)
RDW: 13.8 % (ref 11.5–14.5)
WBC: 9.9 10*3/uL (ref 3.6–11.0)

## 2016-01-06 LAB — HEPATITIS B SURFACE ANTIGEN: Hepatitis B Surface Ag: NEGATIVE

## 2016-01-06 LAB — MAGNESIUM: Magnesium: 1.6 mg/dL — ABNORMAL LOW (ref 1.7–2.4)

## 2016-01-06 LAB — RPR: RPR Ser Ql: NONREACTIVE

## 2016-01-06 MED ORDER — MAGNESIUM OXIDE 400 (241.3 MG) MG PO TABS: 400.0000 mg | ORAL_TABLET | Freq: Every day | ORAL | Status: DC

## 2016-01-06 MED ORDER — OXYCODONE HCL 5 MG PO TABS
5.0000 mg | ORAL_TABLET | ORAL | Status: DC | PRN
Start: 2016-01-06 — End: 2020-11-16

## 2016-01-06 MED ORDER — POTASSIUM CHLORIDE ER 10 MEQ PO TBCR: 10.0000 meq | EXTENDED_RELEASE_TABLET | Freq: Every day | ORAL | Status: DC

## 2016-01-06 MED ORDER — DOXYCYCLINE HYCLATE 100 MG PO TABS: 100.0000 mg | ORAL_TABLET | Freq: Two times a day (BID) | ORAL | Status: DC

## 2016-01-06 MED ORDER — MAGNESIUM SULFATE 2 GM/50ML IV SOLN
2.0000 g | Freq: Once | INTRAVENOUS | Status: AC
  Administered 2016-01-06: 2 g via INTRAVENOUS
  Filled 2016-01-06: qty 50

## 2016-01-06 MED ORDER — METRONIDAZOLE 500 MG PO TABS: 500.0000 mg | ORAL_TABLET | Freq: Three times a day (TID) | ORAL | Status: DC

## 2016-01-06 MED ORDER — ENSURE ENLIVE PO LIQD: 237.0000 mL | Freq: Three times a day (TID) | ORAL | Status: DC

## 2016-01-06 MED ORDER — POTASSIUM CHLORIDE CRYS ER 20 MEQ PO TBCR
40.0000 meq | EXTENDED_RELEASE_TABLET | Freq: Once | ORAL | Status: AC
  Administered 2016-01-06: 40 meq via ORAL
  Filled 2016-01-06: qty 2

## 2016-01-06 MED ORDER — CYCLOBENZAPRINE HCL 10 MG PO TABS: 10.0000 mg | ORAL_TABLET | Freq: Three times a day (TID) | ORAL | Status: DC | PRN

## 2016-01-06 NOTE — Discharge Summary (Signed)
[facility] - [address] at [facility]   PATIENT NAME: Lindsey Campos    MR#:  [id number]  DATE OF BIRTH:  [birth year]  DATE OF ADMISSION:  01/03/2016 ADMITTING PHYSICIAN: Gery Pray, MD  DATE OF DISCHARGE: 01/06/2016  PRIMARY CARE PHYSICIAN: [facility]    ADMISSION DIAGNOSIS:  Sepsis, due to unspecified organism (HCC) [A41.9]  DISCHARGE DIAGNOSIS:  Active Problems:   Sepsis (HCC)   Nausea and vomiting   UTI (lower urinary tract infection)   COPD (chronic obstructive pulmonary disease) (HCC)   SECONDARY DIAGNOSIS:   Past Medical History  Diagnosis Date  . COPD (chronic obstructive pulmonary disease) (HCC)     HOSPITAL COURSE:   1. Clinical sepsis with tachycardia and leukocytosis on admission. This has resolved. Patient never had a fever during hospital course. Nausea vomiting and dehydration could also cause leukocytosis and tachycardia. 2. Pelvic inflammatory disease with gonorrhea. Patient was given IM Rocephin and doxycycline upon discharge home. I advised her that her sexual contact needs to be treated and tested also. 3. Bacterial vaginosis Flagyl was prescribed here in the discharged home on Flagyl 4. Abdominal pain nausea vomiting. Patient now able to tolerate diet. Patient was given IV fluids on presentation and during the hospital course a small prescription of 12 pills of pain medication and muscle relaxer prescribed. 5. History of COPD 6. Constipation. MiraLAX given yesterday 7. Hypokalemia and hypomagnesemia replace magnesium IV today and oral magnesium for 7 days upon discharge home. Potassium replacement orally today and for 3 days upon discharge home 8. Borderline troponin false positive from sepsis.  DISCHARGE CONDITIONS:   Satisfactory  CONSULTS OBTAINED:  Gynecology  DRUG ALLERGIES:  No Known Allergies  DISCHARGE MEDICATIONS:   Current Discharge Medication List    START taking these medications   Details  doxycycline  (VIBRA-TABS) 100 MG tablet Take 1 tablet (100 mg total) by mouth every 12 (twelve) hours. Qty: 24 tablet, Refills: 0    feeding supplement, ENSURE ENLIVE, (ENSURE ENLIVE) LIQD Take 237 mLs by mouth 3 (three) times daily with meals. Qty: 90 Bottle, Refills: 0    magnesium oxide (MAG-OX) 400 (241.3 Mg) MG tablet Take 1 tablet (400 mg total) by mouth at bedtime. Qty: 7 tablet, Refills: 0    metroNIDAZOLE (FLAGYL) 500 MG tablet Take 1 tablet (500 mg total) by mouth 3 (three) times daily. Qty: 18 tablet, Refills: 0    oxyCODONE (OXY IR/ROXICODONE) 5 MG immediate release tablet Take 1 tablet (5 mg total) by mouth every 4 (four) hours as needed for moderate pain or breakthrough pain. Qty: 12 tablet, Refills: 0    potassium chloride (K-DUR) 10 MEQ tablet Take 1 tablet (10 mEq total) by mouth daily. Qty: 3 tablet, Refills: 0      CONTINUE these medications which have CHANGED   Details  cyclobenzaprine (FLEXERIL) 10 MG tablet Take 1 tablet (10 mg total) by mouth 3 (three) times daily as needed for muscle spasms. Qty: 12 tablet, Refills: 0      CONTINUE these medications which have NOT CHANGED   Details  albuterol (PROVENTIL HFA;VENTOLIN HFA) 108 (90 Base) MCG/ACT inhaler Inhale 2 puffs into the lungs every 6 (six) hours as needed for wheezing or shortness of breath. Qty: 1 Inhaler, Refills: 2    ibuprofen (ADVIL,MOTRIN) 600 MG tablet Take 1 tablet (600 mg total) by mouth every 8 (eight) hours as needed for mild pain or moderate pain. Qty: 15 tablet, Refills: 0    LORazepam (ATIVAN) 0.5 MG  tablet Take 1 tablet (0.5 mg total) by mouth at bedtime as needed for anxiety. Qty: 5 tablet, Refills: 0         DISCHARGE INSTRUCTIONS:   Follow-up PMD one week Follow-up gynecology 2 weeks  If you experience worsening of your admission symptoms, develop shortness of breath, life threatening emergency, suicidal or homicidal thoughts you must seek medical attention immediately by calling 911 or  calling your MD immediately  if symptoms less severe.  You Must read complete instructions/literature along with all the possible adverse reactions/side effects for all the Medicines you take and that have been prescribed to you. Take any new Medicines after you have completely understood and accept all the possible adverse reactions/side effects.   Please note  You were cared for by a hospitalist during your hospital stay. If you have any questions about your discharge medications or the care you received while you were in the hospital after you are discharged, you can call the unit and asked to speak with the hospitalist on call if the hospitalist that took care of you is not available. Once you are discharged, your primary care physician will handle any further medical issues. Please note that NO REFILLS for any discharge medications will be authorized once you are discharged, as it is imperative that you return to your primary care physician (or establish a relationship with a primary care physician if you do not have one) for your aftercare needs so that they can reassess your need for medications and monitor your lab values.    Today   CHIEF COMPLAINT:   Chief Complaint  Patient presents with  . Nausea  . Emesis  . Diarrhea    HISTORY OF PRESENT ILLNESS:  Lindsey Campos  is a 40 y.o. female presented with nausea vomiting and abdominal pain   VITAL SIGNS:  Blood pressure 106/69, pulse 80, temperature 98.9 F (37.2 C), temperature source Oral, resp. rate 18, height 5\' 4"  (1.626 m), weight 52.663 kg (116 lb 1.6 oz), last menstrual period 12/13/2015, SpO2 98 %.   PHYSICAL EXAMINATION:  GENERAL:  40 y.o.-year-old patient lying in the bed with no acute distress.  EYES: Pupils equal, round, reactive to light and accommodation. No scleral icterus. Extraocular muscles intact.  HEENT: Head atraumatic, normocephalic. Oropharynx and nasopharynx clear.  NECK:  Supple, no jugular venous  distention. No thyroid enlargement, no tenderness.  LUNGS: Normal breath sounds bilaterally, no wheezing, rales,rhonchi or crepitation. No use of accessory muscles of respiration.  CARDIOVASCULAR: S1, S2 normal. No murmurs, rubs, or gallops.  ABDOMEN: Soft, non-tender, non-distended. Bowel sounds present. No organomegaly or mass.  EXTREMITIES: No pedal edema, cyanosis, or clubbing.  NEUROLOGIC: Cranial nerves II through XII are intact. Muscle strength 5/5 in all extremities. Sensation intact. Gait not checked.  PSYCHIATRIC: The patient is alert and oriented x 3.  SKIN: No obvious rash, lesion, or ulcer.   DATA REVIEW:   CBC  Recent Labs Lab 01/06/16 0507  WBC 9.9  HGB 10.3*  HCT 30.0*  PLT 187    Chemistries   Recent Labs Lab 01/03/16 1914  01/06/16 0507  NA 136  < > 138  K 3.1*  < > 3.1*  CL 106  < > 111  CO2 20*  < > 23  GLUCOSE 129*  < > 95  BUN 11  < > 5*  CREATININE 0.61  < > 0.48  CALCIUM 9.3  < > 7.8*  MG  --   < > 1.6*  AST 18  --   --   ALT 16  --   --   ALKPHOS 75  --   --   BILITOT 1.0  --   --   < > = values in this interval not displayed.  Cardiac Enzymes  Recent Labs Lab 01/03/16 1914  TROPONINI 0.04*    Microbiology Results  Results for orders placed or performed during the hospital encounter of 01/03/16  Urine culture     Status: None   Collection Time: 01/03/16  7:54 PM  Result Value Ref Range Status   Specimen Description URINE, RANDOM  Final   Special Requests NONE  Final   Culture NO GROWTH [facility]   Final   Report Status 01/05/2016 FINAL  Final  Blood Culture (routine x 2)     Status: None (Preliminary result)   Collection Time: 01/03/16 10:02 PM  Result Value Ref Range Status   Specimen Description BLOOD RIGHT ASSIST CONTROL  Final   Special Requests   Final    BOTTLES DRAWN AEROBIC AND ANAEROBIC 10CCAERO,15CCANA   Culture NO GROWTH 3 DAYS  Final   Report Status PENDING  Incomplete  Blood Culture  (routine x 2)     Status: None (Preliminary result)   Collection Time: 01/03/16 10:02 PM  Result Value Ref Range Status   Specimen Description BLOOD LEFT WRIST  Final   Special Requests BOTTLES DRAWN AEROBIC AND ANAEROBIC 5CCAERO,5CCANA  Final   Culture NO GROWTH 3 DAYS  Final   Report Status PENDING  Incomplete  Chlamydia/NGC rt PCR ([facility] only)     Status: Abnormal   Collection Time: 01/03/16 11:24 PM  Result Value Ref Range Status   Specimen source GC/Chlam ENDOCERVICAL  Final   Chlamydia Tr NOT DETECTED NOT DETECTED Final   N gonorrhoeae DETECTED (A) NOT DETECTED Final    Comment: (NOTE) 100  This methodology has not been evaluated in pregnant women or in 200  patients with a history of hysterectomy. 300 400  This methodology will not be performed on patients less than 6914  years of age.   Wet prep, genital     Status: Abnormal   Collection Time: 01/04/16 12:34 AM  Result Value Ref Range Status   Yeast Wet Prep HPF POC NONE SEEN NONE SEEN Final   Trich, Wet Prep NONE SEEN NONE SEEN Final   Clue Cells Wet Prep HPF POC PRESENT (A) NONE SEEN Final   WBC, Wet Prep HPF POC MANY (A) NONE SEEN Final   Sperm NONE SEEN  Final    Management plans discussed with the patient, family and they are in agreement.  CODE STATUS:     Code Status Orders        Start     Ordered   01/04/16 0141  Full code   Continuous     01/04/16 0140    Code Status History    Date Active Date Inactive Code Status Order ID Comments User Context   This patient has a current code status but no historical code status.      TOTAL TIME TAKING CARE OF THIS PATIENT: 35 minutes.    Alford HighlandWIETING, Amanat Hackel M.D on 01/06/2016 at 10:44 AM  Between 7am to 6pm - Pager - [phone]  After 6pm go to [email or web address] - password [email or web address]  [facility] Office  [phone]  CC: Primary care physician; [facility]

## 2016-01-06 NOTE — Progress Notes (Signed)
Patient was discharged home with daughter. IVs removed by patient d/t sensitively to the removal of the tape. Reviewed last dose given of meds, scripts, follow-up appointment, and education r/t her diagnosis. Patient was able to walk without difficultly.

## 2016-01-08 LAB — CULTURE, BLOOD (ROUTINE X 2)
CULTURE: NO GROWTH
Culture: NO GROWTH

## 2016-01-09 ENCOUNTER — Encounter: Payer: Self-pay | Admitting: Emergency Medicine

## 2016-01-09 ENCOUNTER — Emergency Department
Admission: EM | Admit: 2016-01-09 | Discharge: 2016-01-09 | Disposition: A | Discharge status: Home / Self Care | Payer: Medicaid Other | Attending: Emergency Medicine | Admitting: Emergency Medicine

## 2016-01-09 DIAGNOSIS — Z79899 Other long term (current) drug therapy: Secondary | ICD-10-CM | POA: Diagnosis not present

## 2016-01-09 DIAGNOSIS — Z7951 Long term (current) use of inhaled steroids: Secondary | ICD-10-CM | POA: Diagnosis not present

## 2016-01-09 DIAGNOSIS — R109 Unspecified abdominal pain: Secondary | ICD-10-CM | POA: Diagnosis present

## 2016-01-09 DIAGNOSIS — F17210 Nicotine dependence, cigarettes, uncomplicated: Secondary | ICD-10-CM | POA: Insufficient documentation

## 2016-01-09 DIAGNOSIS — R197 Diarrhea, unspecified: Secondary | ICD-10-CM | POA: Diagnosis not present

## 2016-01-09 DIAGNOSIS — J449 Chronic obstructive pulmonary disease, unspecified: Secondary | ICD-10-CM | POA: Insufficient documentation

## 2016-01-09 DIAGNOSIS — K219 Gastro-esophageal reflux disease without esophagitis: Secondary | ICD-10-CM | POA: Diagnosis not present

## 2016-01-09 DIAGNOSIS — R1110 Vomiting, unspecified: Secondary | ICD-10-CM

## 2016-01-09 LAB — COMPREHENSIVE METABOLIC PANEL
ALT: 13 U/L — AB (ref 14–54)
AST: 22 U/L (ref 15–41)
Albumin: 3.7 g/dL (ref 3.5–5.0)
Alkaline Phosphatase: 67 U/L (ref 38–126)
Anion gap: 11 (ref 5–15)
BILIRUBIN TOTAL: 0.7 mg/dL (ref 0.3–1.2)
BUN: 10 mg/dL (ref 6–20)
CO2: 22 mmol/L (ref 22–32)
CREATININE: 0.63 mg/dL (ref 0.44–1.00)
Calcium: 9.2 mg/dL (ref 8.9–10.3)
Chloride: 106 mmol/L (ref 101–111)
Glucose, Bld: 132 mg/dL — ABNORMAL HIGH (ref 65–99)
Potassium: 3.1 mmol/L — ABNORMAL LOW (ref 3.5–5.1)
SODIUM: 139 mmol/L (ref 135–145)
TOTAL PROTEIN: 7.7 g/dL (ref 6.5–8.1)

## 2016-01-09 LAB — URINALYSIS COMPLETE WITH MICROSCOPIC (ARMC ONLY)
Bilirubin Urine: NEGATIVE
GLUCOSE, UA: NEGATIVE mg/dL
Leukocytes, UA: NEGATIVE
Nitrite: NEGATIVE
PH: 8 (ref 5.0–8.0)
Protein, ur: NEGATIVE mg/dL
Specific Gravity, Urine: 1.01 (ref 1.005–1.030)

## 2016-01-09 LAB — CBC WITH DIFFERENTIAL/PLATELET
BASOS ABS: 0 10*3/uL (ref 0–0.1)
Basophils Relative: 0 %
EOS PCT: 0 %
Eosinophils Absolute: 0 10*3/uL (ref 0–0.7)
HCT: 38.9 % (ref 35.0–47.0)
HEMOGLOBIN: 13.5 g/dL (ref 12.0–16.0)
LYMPHS ABS: 1.8 10*3/uL (ref 1.0–3.6)
LYMPHS PCT: 14 %
MCH: 32.1 pg (ref 26.0–34.0)
MCHC: 34.8 g/dL (ref 32.0–36.0)
MCV: 92.3 fL (ref 80.0–100.0)
Monocytes Absolute: 1.1 10*3/uL — ABNORMAL HIGH (ref 0.2–0.9)
Monocytes Relative: 8 %
NEUTROS PCT: 78 %
Neutro Abs: 10.1 10*3/uL — ABNORMAL HIGH (ref 1.4–6.5)
PLATELETS: 378 10*3/uL (ref 150–440)
RBC: 4.22 MIL/uL (ref 3.80–5.20)
RDW: 13.8 % (ref 11.5–14.5)
WBC: 13 10*3/uL — AB (ref 3.6–11.0)

## 2016-01-09 LAB — LIPASE, BLOOD: Lipase: 20 U/L (ref 11–51)

## 2016-01-09 LAB — HCG, QUANTITATIVE, PREGNANCY: HCG, BETA CHAIN, QUANT, S: 1 m[IU]/mL (ref <5)

## 2016-01-09 MED ORDER — FAMOTIDINE 20 MG PO TABS: 20.0000 mg | ORAL_TABLET | Freq: Two times a day (BID) | ORAL | Status: DC

## 2016-01-09 MED ORDER — DEXTROSE 5 % IV SOLN
1.0000 g | Freq: Once | INTRAVENOUS | Status: AC
  Administered 2016-01-09: 1 g via INTRAVENOUS
  Filled 2016-01-09: qty 10

## 2016-01-09 MED ORDER — LORAZEPAM 2 MG/ML IJ SOLN
1.0000 mg | Freq: Once | INTRAMUSCULAR | Status: AC
  Administered 2016-01-09: 1 mg via INTRAVENOUS
  Filled 2016-01-09: qty 1

## 2016-01-09 MED ORDER — FAMOTIDINE IN NACL 20-0.9 MG/50ML-% IV SOLN
20.0000 mg | Freq: Once | INTRAVENOUS | Status: AC
  Administered 2016-01-09: 20 mg via INTRAVENOUS
  Filled 2016-01-09: qty 50

## 2016-01-09 MED ORDER — SODIUM CHLORIDE 0.9 % IV SOLN
1000.0000 mL | Freq: Once | INTRAVENOUS | Status: AC
  Administered 2016-01-09: 1000 mL via INTRAVENOUS

## 2016-01-09 MED ORDER — MORPHINE SULFATE (PF) 4 MG/ML IV SOLN
4.0000 mg | Freq: Once | INTRAVENOUS | Status: AC
  Administered 2016-01-09: 4 mg via INTRAVENOUS
  Filled 2016-01-09: qty 1

## 2016-01-09 MED ORDER — PROMETHAZINE HCL 25 MG PO TABS: 25.0000 mg | ORAL_TABLET | Freq: Four times a day (QID) | ORAL | Status: DC | PRN

## 2016-01-09 MED ORDER — ONDANSETRON HCL 4 MG/2ML IJ SOLN
4.0000 mg | Freq: Once | INTRAMUSCULAR | Status: AC
  Administered 2016-01-09: 4 mg via INTRAVENOUS
  Filled 2016-01-09: qty 2

## 2016-01-09 NOTE — ED Provider Notes (Addendum)
[facility] Emergency Department Provider Note        Time seen: ----------------------------------------- 8:42 AM on 01/09/2016 -----------------------------------------    I have reviewed the triage vital signs and the nursing notes.   HISTORY  Chief Complaint No chief complaint on file.    HPI Lindsey Campos is a 40 y.o. female recently admitted to the hospital for possible sepsis, gonorrhea and PID and vomiting. Patient initially was admitted with marked leukocytosis. She was discharged with doxycycline and Flagyl. Patient states she's had significant vomiting and diarrhea starting last 24 hours. She did have vomiting while she was in the hospital. She states she hurts everywhere this time.   Past Medical History  Diagnosis Date  . COPD (chronic obstructive pulmonary disease) [facility])     Patient Active Problem List   Diagnosis Date Noted  . Sepsis (HCC) 01/04/2016  . Nausea and vomiting 01/04/2016  . UTI (lower urinary tract infection) 01/04/2016  . COPD (chronic obstructive pulmonary disease) (HCC) 01/04/2016    No past surgical history on file.  Allergies Review of patient's allergies indicates no known allergies.  Social History Social History  Substance Use Topics  . Smoking status: Current Every Day Smoker -- 1.00 packs/day    Types: Cigarettes  . Smokeless tobacco: Not on file  . Alcohol Use: No     Comment: occasionally    Review of Systems Constitutional: Positive for fever Cardiovascular: Negative for chest pain. Respiratory: Negative for shortness of breath. Gastrointestinal: Positive for abdominal pain, vomiting and diarrhea Genitourinary: Negative for dysuria. Musculoskeletal: Negative for back pain. Positive for diffuse muscle aches and pains Skin: Negative for rash. Neurological: Negative for headaches, positive for weakness  10-point ROS otherwise  negative.  ____________________________________________   PHYSICAL EXAM:  VITAL SIGNS: ED Triage Vitals  Enc Vitals Group     BP --      Pulse --      Resp --      Temp --      Temp src --      SpO2 --      Weight --      Height --      Head Cir --      Peak Flow --      Pain Score --      Pain Loc --      Pain Edu? --      Excl. in GC? --     Constitutional: Alert and oriented. Anxious, mild distress Eyes: Conjunctivae are normal. PERRL. Normal extraocular movements. ENT   Head: Normocephalic and atraumatic.   Nose: No congestion/rhinnorhea.   Mouth/Throat: Mucous membranes are moist.   Neck: No stridor. Cardiovascular: Normal rate, regular rhythm. No murmurs, rubs, or gallops. Respiratory: Normal respiratory effort without tachypnea nor retractions. Breath sounds are clear and equal bilaterally. No wheezes/rales/rhonchi. Gastrointestinal: Soft and nontender. Normal bowel sounds Musculoskeletal: Nontender with normal range of motion in all extremities. No lower extremity tenderness nor edema. Neurologic:  Normal speech and language. No gross focal neurologic deficits are appreciated.  Skin:  Skin is warm, dry and intact. No rash noted. Psychiatric: Anxious mood and affect. ____________________________________________  ED COURSE:  Pertinent labs & imaging results that were available during my care of the patient were reviewed by me and considered in my medical decision making (see chart for details). Patient presents to ER with vomiting and diarrhea. She'll receive IV fluids, we will recheck basic labs. This is possibly a side effect to the  antibiotic she is taking. ____________________________________________    LABS (pertinent positives/negatives)  Labs Reviewed  CBC WITH DIFFERENTIAL/PLATELET - Abnormal; Notable for the following:    WBC 13.0 (*)    Neutro Abs 10.1 (*)    Monocytes Absolute 1.1 (*)    All other components within normal limits   COMPREHENSIVE METABOLIC PANEL - Abnormal; Notable for the following:    Potassium 3.1 (*)    Glucose, Bld 132 (*)    ALT 13 (*)    All other components within normal limits  URINALYSIS COMPLETEWITH MICROSCOPIC ([facility] ONLY) - Abnormal; Notable for the following:    Color, Urine YELLOW (*)    APPearance CLOUDY (*)    Ketones, ur 1+ (*)    Hgb urine dipstick 3+ (*)    Bacteria, UA RARE (*)    Squamous Epithelial / LPF 0-5 (*)    All other components within normal limits  LIPASE, BLOOD  HCG, QUANTITATIVE, PREGNANCY   ____________________________________________  FINAL ASSESSMENT AND PLAN  Vomiting and diarrhea, GERD, recent gonorrhea infection  Plan: Patient with labs as dictated above, patient is in no acute distress. Currently she is not had any further vomiting, diarrhea or abdominal pain. She has symptoms of Norovirus, likely exacerbating GERD. She was given an additional dose of Rocephin for gonorrhea. She'll be discharged with antiemetics and ant-acids.Hematuria on today's lab work is likely due to her being on her menses.    Emily FilbertWilliams, Amyria Komar E, MD   Note: This dictation was prepared with Dragon dictation. Any transcriptional errors that result from this process are unintentional   Emily FilbertJonathan E Arlana Canizales, MD 01/09/16 1152  Emily FilbertJonathan E Dior Stepter, MD 01/09/16 1152  Emily FilbertJonathan E Yaresly Menzel, MD 01/09/16 1204

## 2016-01-09 NOTE — ED Notes (Addendum)
Pt to ed via [facility] from home with abd pain, n/v/d. Pt recently admitted for bacterial infection and was in the hosp for four days. Pt returns today not feeling any better.  vss per ems.  18 g IV est by ems and 4mg  zofran given iv in route to ed. edp at bedside on arrival. Pt was given flagyl and doxycycline at time of discharge. Pt states diarrhea and vomiting started last night. Pt currently on the toilet having diarrhea.

## 2016-01-09 NOTE — Discharge Instructions (Signed)
Gastroesophageal Reflux Disease, Adult Normally, food travels down the esophagus and stays in the stomach to be digested. If a person has gastroesophageal reflux disease (GERD), food and stomach acid move back up into the esophagus. When this happens, the esophagus becomes sore and swollen (inflamed). Over time, GERD can make small holes (ulcers) in the lining of the esophagus. HOME CARE Diet  Follow a diet as told by your doctor. You may need to avoid foods and drinks such as:  Coffee and tea (with or without caffeine).  Drinks that contain alcohol.  Energy drinks and sports drinks.  Carbonated drinks or sodas.  Chocolate and cocoa.  Peppermint and mint flavorings.  Garlic and onions.  Horseradish.  Spicy and acidic foods, such as peppers, chili powder, curry powder, vinegar, hot sauces, and BBQ sauce.  Citrus fruit juices and citrus fruits, such as oranges, lemons, and limes.  Tomato-based foods, such as red sauce, chili, salsa, and pizza with red sauce.  Fried and fatty foods, such as donuts, french fries, potato chips, and high-fat dressings.  High-fat meats, such as hot dogs, rib eye steak, sausage, ham, and bacon.  High-fat dairy items, such as whole milk, butter, and cream cheese.  Eat small meals often. Avoid eating large meals.  Avoid drinking large amounts of liquid with your meals.  Avoid eating meals during the 2-3 hours before bedtime.  Avoid lying down right after you eat.  Do not exercise right after you eat. General Instructions  Pay attention to any changes in your symptoms.  Take over-the-counter and prescription medicines only as told by your doctor. Do not take aspirin, ibuprofen, or other NSAIDs unless your doctor says it is okay.  Do not use any tobacco products, including cigarettes, chewing tobacco, and e-cigarettes. If you need help quitting, ask your doctor.  Wear loose clothes. Do not wear anything tight around your waist.  Raise  (elevate) the head of your bed about 6 inches (15 cm).  Try to lower your stress. If you need help doing this, ask your doctor.  If you are overweight, lose an amount of weight that is healthy for you. Ask your doctor about a safe weight loss goal.  Keep all follow-up visits as told by your doctor. This is important. GET HELP IF:  You have new symptoms.  You lose weight and you do not know why it is happening.  You have trouble swallowing, or it hurts to swallow.  You have wheezing or a cough that keeps happening.  Your symptoms do not get better with treatment.  You have a hoarse voice. GET HELP RIGHT AWAY IF:  You have pain in your arms, neck, jaw, teeth, or back.  You feel sweaty, dizzy, or light-headed.  You have chest pain or shortness of breath.  You throw up (vomit) and your throw up looks like blood or coffee grounds.  You pass out (faint).  Your poop (stool) is bloody or black.  You cannot swallow, drink, or eat.   This information is not intended to replace advice given to you by your health care provider. Make sure you discuss any questions you have with your health care provider.   Document Released: 12/03/2007 Document Revised: 03/07/2015 Document Reviewed: 10/11/2014 Elsevier Interactive Patient Education 2016 [employer] Norovirus Infection A norovirus infection is caused by exposure to a virus in a group of similar viruses (noroviruses). This type of infection causes inflammation in your stomach and intestines (gastroenteritis). Norovirus is the most common cause of  gastroenteritis. It also causes food poisoning. Anyone can get a norovirus infection. It spreads very easily (contagious). You can get it from contaminated food, water, surfaces, or other people. Norovirus is found in the stool or vomit of infected people. You can spread the infection as soon as you feel sick until 2 weeks after you recover.  Symptoms usually begin within 2 days after you  become infected. Most norovirus symptoms affect the digestive system. CAUSES Norovirus infection is caused by contact with norovirus. You can catch norovirus if you:  Eat or drink something contaminated with norovirus.  Touch surfaces or objects contaminated with norovirus and then put your hand in your mouth.  Have direct contact with an infected person who has symptoms.  Share food, drink, or utensils with someone with who is sick with norovirus. SIGNS AND SYMPTOMS Symptoms of norovirus may include:  Nausea.  Vomiting.  Diarrhea.  Stomach cramps.  Fever.  Chills.  Headache.  Muscle aches.  Tiredness. DIAGNOSIS Your health care provider may suspect norovirus based on your symptoms and physical exam. Your health care provider may also test a sample of your stool or vomit for the virus.  TREATMENT There is no specific treatment for norovirus. Most people get better without treatment in about 2 days. HOME CARE INSTRUCTIONS  Replace lost fluids by drinking plenty of water or rehydration fluids containing important minerals called electrolytes. This prevents dehydration. Drink enough fluid to keep your urine clear or pale yellow.  Do not prepare food for others while you are infected. Wait at least 3 days after recovering from the illness to do that. PREVENTION   Wash your hands often, especially after using the toilet or changing a diaper.  Wash fruits and vegetables thoroughly before preparing or serving them.  Throw out any food that a sick person may have touched.  Disinfect contaminated surfaces immediately after someone in the household has been sick. Use a bleach-based household cleaner.  Immediately remove and wash soiled clothes or sheets. SEEK MEDICAL CARE IF:  Your vomiting, diarrhea, and stomach pain is getting worse.  Your symptoms of norovirus do not go away after 2-3 days. SEEK IMMEDIATE MEDICAL CARE IF:  You develop symptoms of dehydration that do  not improve with fluid replacement. This may include:  Excessive sleepiness.  Lack of tears.  Dry mouth.  Dizziness when standing.  Weak pulse.   This information is not intended to replace advice given to you by your health care provider. Make sure you discuss any questions you have with your health care provider.   Document Released: 09/06/2002 Document Revised: 07/07/2014 Document Reviewed: 11/24/2013 Elsevier Interactive Patient Education [employer]

## 2016-01-09 NOTE — ED Notes (Signed)
Spoke to patient about plan of care (discharge). Patient shaking her head no. Patient states, "I'm not going home. Tell the Dr to come in here". Dr. Mayford KnifeWilliams notified. States he will speak to patient.

## 2016-07-17 IMAGING — CT CT ABD-PELV W/ CM
2 of 4 series · 15 of 46 positions shown, 17 images · IV contrast (iopamidol)
Comparison: Pelvic ultrasound performed 12/05/2009

CLINICAL DATA: Acute onset of generalized abdominal pain, nausea,
vomiting and diarrhea. Initial encounter.

EXAM:
CT ABDOMEN AND PELVIS WITH CONTRAST
TECHNIQUE: Multidetector CT imaging of the abdomen and pelvis was performed
using the standard protocol following bolus administration of
intravenous contrast.
CONTRAST:  75mL [id number] IOPAMIDOL ([id number]-[id number]) INJECTION 61%

[Series 2: routine abd pel with · axial · 0.62mm/px · z∈[-992,-642]mm · 12 of 81 slices shown, 14 images]
[im 7/81  soft-tissue]
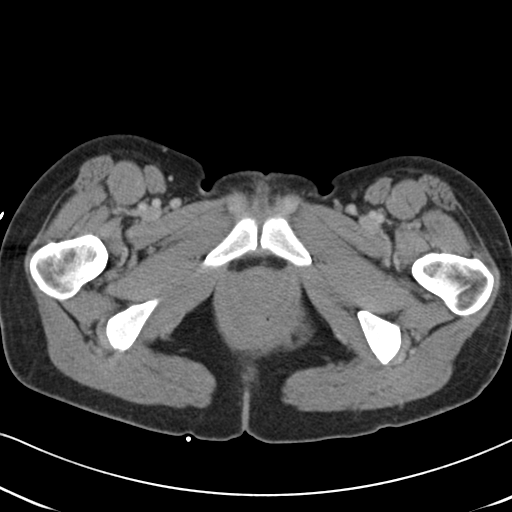
[im 7/81  bone]
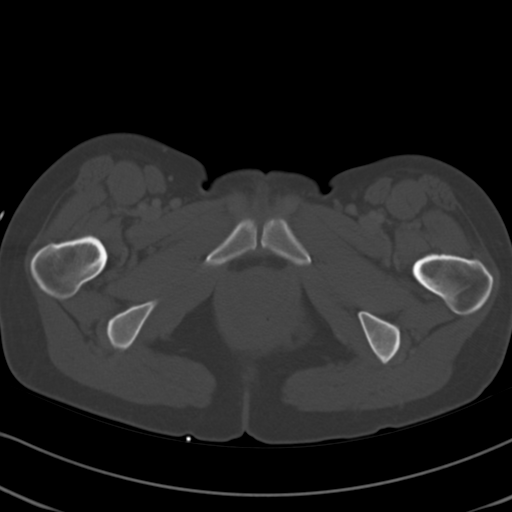
[im 13/81  soft-tissue]
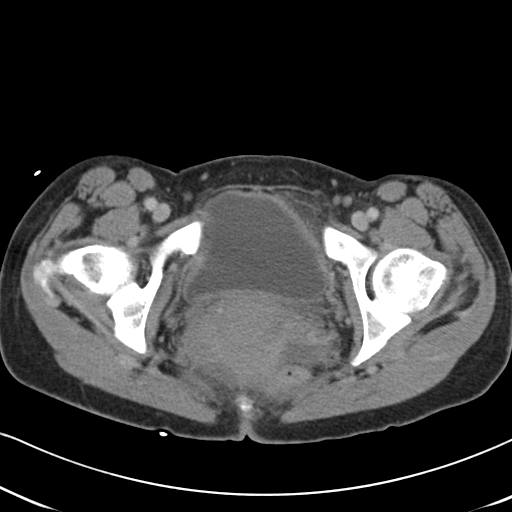
[im 20/81  soft-tissue]
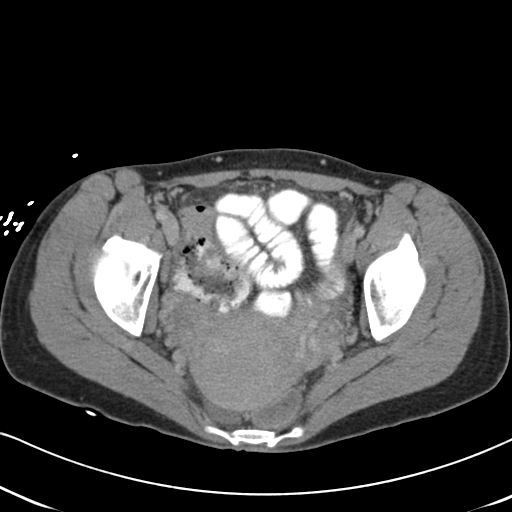
[im 26/81  soft-tissue]
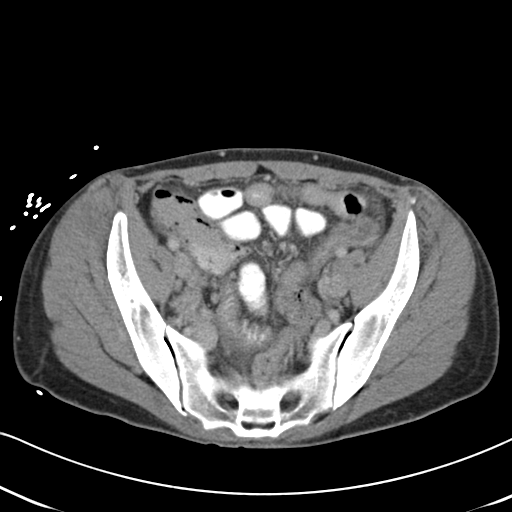
[im 33/81  soft-tissue]
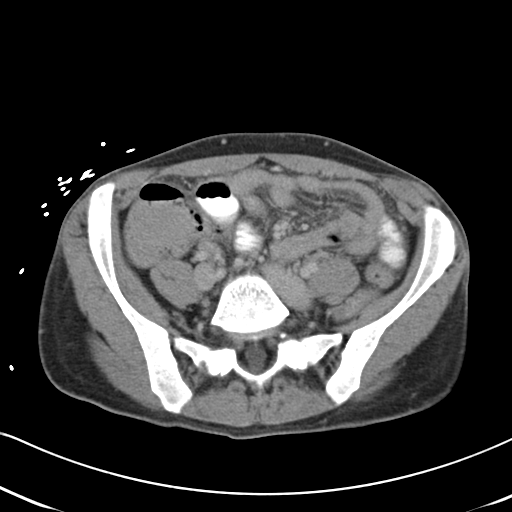
[im 39/81  soft-tissue]
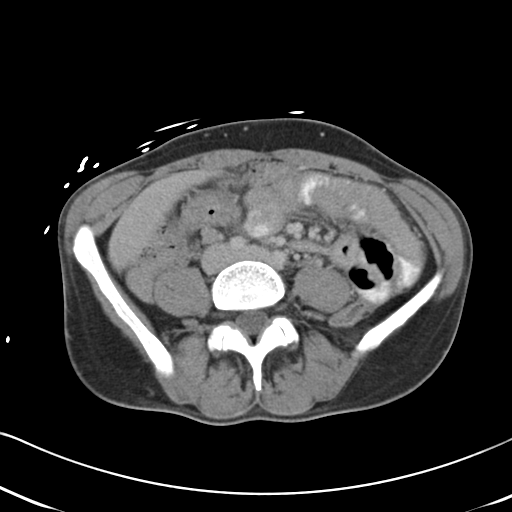
[im 45/81  soft-tissue]
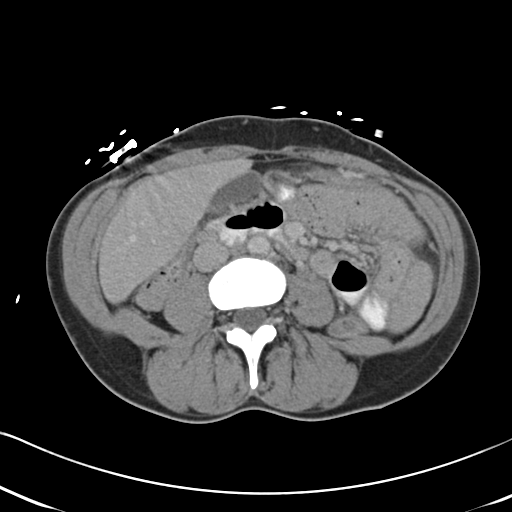
[im 52/81  soft-tissue]
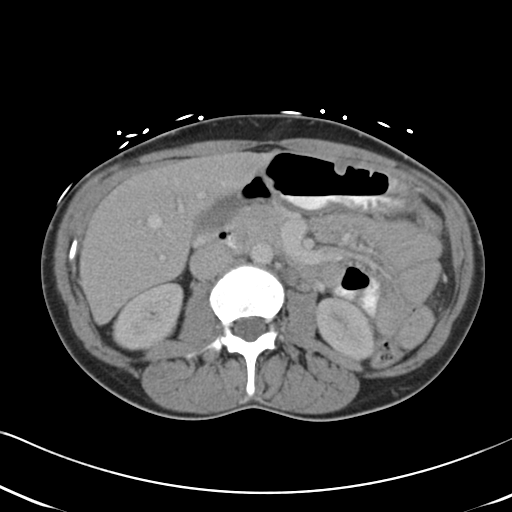
[im 58/81  soft-tissue]
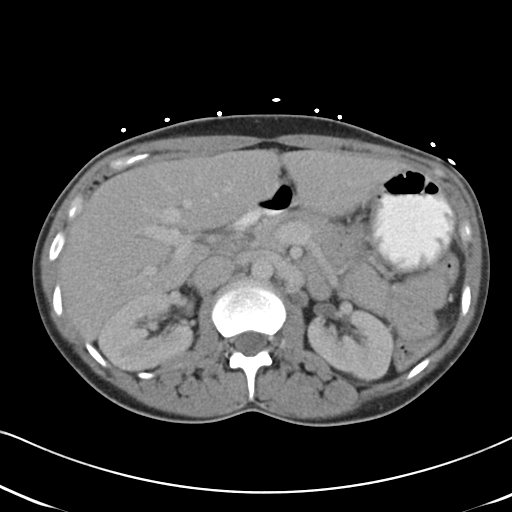
[im 58/81  bone]
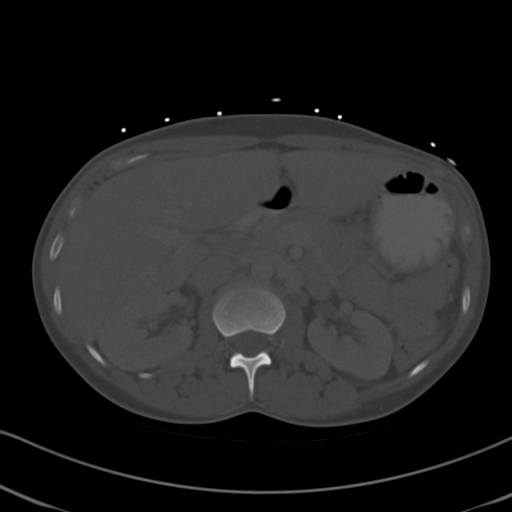
[im 65/81  soft-tissue]
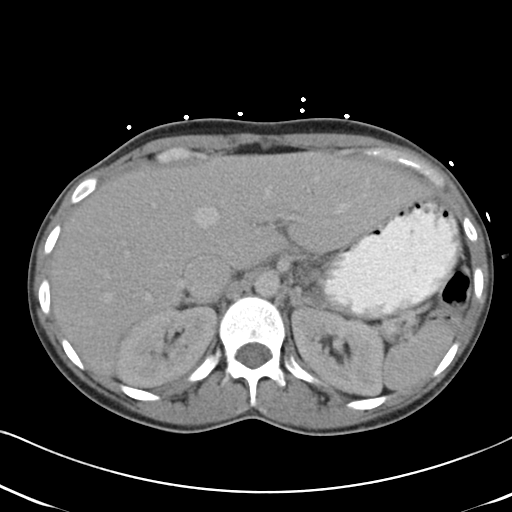
[im 71/81  soft-tissue]
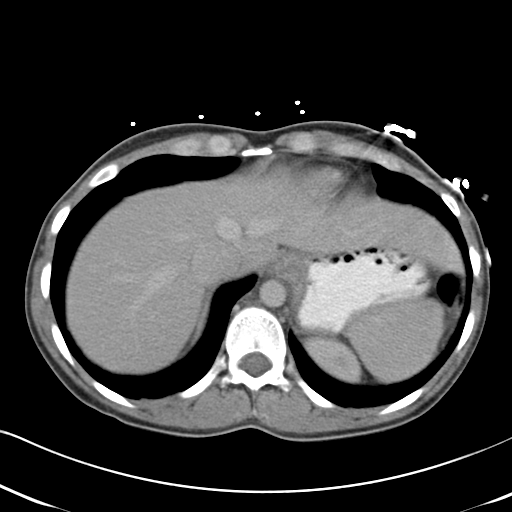
[im 77/81  soft-tissue]
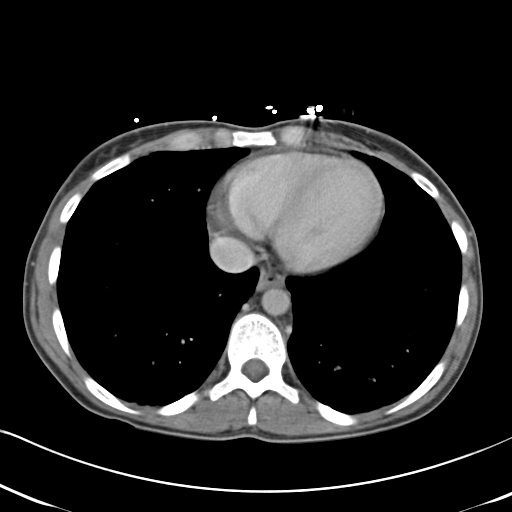

[Series 5: cor routine abd pel with · coronal · 0.59mm/px · 3 of 104 slices shown]
[im 35/104  soft-tissue]
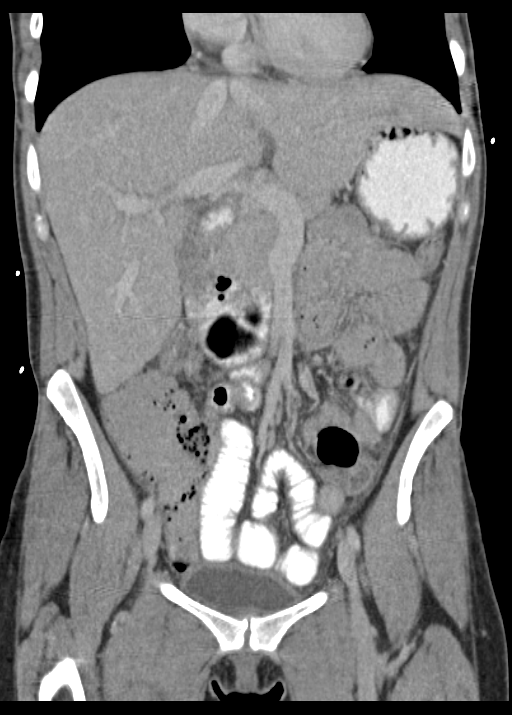
[im 46/104  soft-tissue]
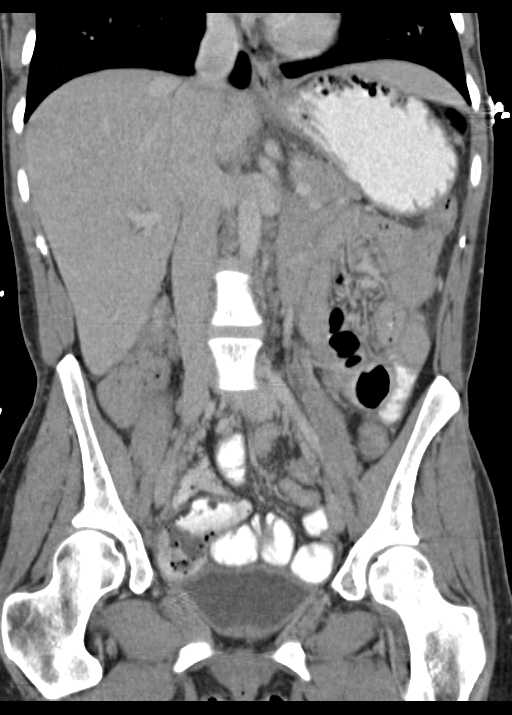
[im 58/104  soft-tissue]
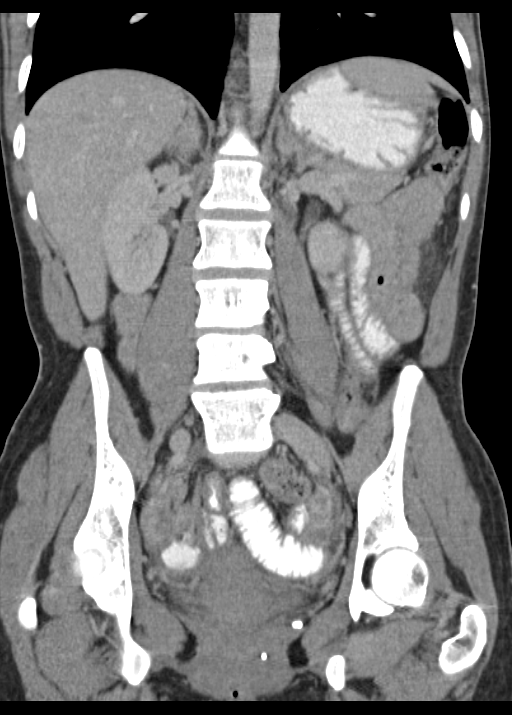

[15 of 46 positions shown; findings below may reference images not displayed]

FINDINGS: Minimal bibasilar atelectasis is noted.

The liver and spleen are unremarkable in appearance. The gallbladder
is within normal limits. The pancreas and adrenal glands are
unremarkable.

The kidneys are unremarkable in appearance. There is no evidence of
hydronephrosis. No renal or ureteral stones are seen. No perinephric
stranding is appreciated.

No free fluid is identified. The small bowel is unremarkable in
appearance. The stomach is within normal limits. No acute vascular
abnormalities are seen. Minimal calcification is noted along the
abdominal aorta.

The appendix is not well characterized; there is no evidence of
appendicitis. The colon is grossly unremarkable in appearance.

The bladder is mildly distended. Mild soft tissue inflammation about
the bladder could reflect cystitis. Soft tissue inflammation is also
noted about the uterus. Would correlate for any evidence of pelvic
inflammatory disease. Trace fluid is seen tracking about the adnexa
bilaterally. The ovaries are otherwise grossly unremarkable. No
inguinal lymphadenopathy is seen.

No acute osseous abnormalities are identified.
IMPRESSION: 1. Soft tissue inflammation about the uterus. Would correlate for
any evidence of pelvic inflammatory disease. Next
2. Mild soft tissue inflammation about the bladder could reflect
cystitis.
3. Minimal calcification along the abdominal aorta, somewhat
advanced for age.

## 2016-09-24 ENCOUNTER — Emergency Department
Admission: EM | Admit: 2016-09-24 | Discharge: 2016-09-24 | Disposition: A | Discharge status: Home / Self Care | Payer: Medicaid Other | Attending: Emergency Medicine | Admitting: Emergency Medicine

## 2016-09-24 ENCOUNTER — Emergency Department: Payer: Medicaid Other

## 2016-09-24 ENCOUNTER — Encounter: Payer: Self-pay | Admitting: Emergency Medicine

## 2016-09-24 ENCOUNTER — Encounter: Payer: Self-pay | Admitting: *Deleted

## 2016-09-24 ENCOUNTER — Ambulatory Visit
Admission: EM | Admit: 2016-09-24 | Discharge: 2016-09-24 | Disposition: A | Discharge status: Home / Self Care | Payer: Medicaid Other | Attending: Internal Medicine | Admitting: Internal Medicine

## 2016-09-24 DIAGNOSIS — O209 Hemorrhage in early pregnancy, unspecified: Secondary | ICD-10-CM | POA: Diagnosis present

## 2016-09-24 DIAGNOSIS — J449 Chronic obstructive pulmonary disease, unspecified: Secondary | ICD-10-CM | POA: Insufficient documentation

## 2016-09-24 DIAGNOSIS — N939 Abnormal uterine and vaginal bleeding, unspecified: Secondary | ICD-10-CM

## 2016-09-24 DIAGNOSIS — F17210 Nicotine dependence, cigarettes, uncomplicated: Secondary | ICD-10-CM | POA: Insufficient documentation

## 2016-09-24 DIAGNOSIS — Z3A12 12 weeks gestation of pregnancy: Secondary | ICD-10-CM | POA: Insufficient documentation

## 2016-09-24 DIAGNOSIS — Z5321 Procedure and treatment not carried out due to patient leaving prior to being seen by health care provider: Secondary | ICD-10-CM | POA: Insufficient documentation

## 2016-09-24 DIAGNOSIS — R109 Unspecified abdominal pain: Secondary | ICD-10-CM

## 2016-09-24 LAB — CBC WITH DIFFERENTIAL/PLATELET
BASOS PCT: 1 %
Basophils Absolute: 0.1 10*3/uL (ref 0–0.1)
EOS ABS: 0.2 10*3/uL (ref 0–0.7)
Eosinophils Relative: 2 %
HCT: 33.5 % — ABNORMAL LOW (ref 35.0–47.0)
HEMOGLOBIN: 11.7 g/dL — AB (ref 12.0–16.0)
LYMPHS ABS: 2.8 10*3/uL (ref 1.0–3.6)
Lymphocytes Relative: 28 %
MCH: 32 pg (ref 26.0–34.0)
MCHC: 34.8 g/dL (ref 32.0–36.0)
MCV: 92.1 fL (ref 80.0–100.0)
Monocytes Absolute: 1 10*3/uL — ABNORMAL HIGH (ref 0.2–0.9)
Monocytes Relative: 10 %
NEUTROS PCT: 59 %
Neutro Abs: 5.7 10*3/uL (ref 1.4–6.5)
Platelets: 271 10*3/uL (ref 150–440)
RBC: 3.64 MIL/uL — AB (ref 3.80–5.20)
RDW: 13.4 % (ref 11.5–14.5)
WBC: 9.7 10*3/uL (ref 3.6–11.0)

## 2016-09-24 LAB — URINALYSIS, COMPLETE (UACMP) WITH MICROSCOPIC
Bacteria, UA: NONE SEEN
Bilirubin Urine: NEGATIVE
Glucose, UA: NEGATIVE mg/dL
KETONES UR: NEGATIVE mg/dL
Nitrite: NEGATIVE
PH: 6 (ref 5.0–8.0)
Protein, ur: NEGATIVE mg/dL
Specific Gravity, Urine: 1.02 (ref 1.005–1.030)

## 2016-09-24 LAB — HCG, QUANTITATIVE, PREGNANCY: hCG, Beta Chain, Quant, S: 57091 m[IU]/mL — ABNORMAL HIGH (ref <5)

## 2016-09-24 LAB — ABO/RH: ABO/RH(D): A POS

## 2016-09-24 MED ORDER — ACETAMINOPHEN 325 MG PO TABS
ORAL_TABLET | ORAL | Status: AC
  Administered 2016-09-24: 650 mg via ORAL
  Filled 2016-09-24: qty 2

## 2016-09-24 MED ORDER — ACETAMINOPHEN 325 MG PO TABS
650.0000 mg | ORAL_TABLET | Freq: Once | ORAL | Status: AC
  Administered 2016-09-24: 650 mg via ORAL

## 2016-09-24 NOTE — Discharge Instructions (Signed)
Go directly to Emergency room as discussed.  °

## 2016-09-24 NOTE — ED Triage Notes (Signed)
Patient states that she is pregnant but unsure how many weeks. Patient states that her last normal period was in January. Patient states that [facility] cramping and vaginal bleeding and was sent here for an ultra sound.

## 2016-09-24 NOTE — ED Provider Notes (Signed)
[facility] ____________________________________________  Time seen: Approximately 8:37 PM  I have reviewed the triage vital signs and the nursing notes.   HISTORY  Chief Complaint Abdominal Pain and Vaginal Discharge   HPI Lindsey Campos is a 41 y.o. female presenting with friend at bedside for evaluation of lower abdominal pain, abdominal cramping and vaginal spotting. Patient reports she had a home pregnancy test last week. Patient reports onset of vaginal bleeding and spotting this morning and has been present each time when she wiped. States blood is pinkish to red, denies any other. Denies blood soaking underwear. Patient reports that she has been having rest tenderness as well as nausea for the last month. Patient reports she had a normal menstrual cycle in January, light and short menstrual in February and has not had a menstrual cycle in March. Reports sexually active with same partner 20 years and does not use protection or birth control. Reports gravida 8, per 4, 2 miscarriages, 2 abortions. Reports last Miscarriage was approximately 3 years ago at 6 months gestation with twins.  Denies chest pain, shortness of breath, fevers or recent sickness. Reports has been urinating more frequently which she associated with pregnancy. Denies burning with urination or urinary urgency. Denies blood in urine.  [facility]: PCP   Past Medical History:  Diagnosis Date  . COPD (chronic obstructive pulmonary disease) [facility])     Patient Active Problem List   Diagnosis Date Noted  . Sepsis (HCC) 01/04/2016  . Nausea and vomiting 01/04/2016  . UTI (lower urinary tract infection) 01/04/2016  . COPD (chronic obstructive pulmonary disease) (HCC) 01/04/2016    History reviewed. No pertinent surgical history.   No current facility-administered medications for this encounter.   Current Outpatient Prescriptions:  .  albuterol (PROVENTIL HFA;VENTOLIN HFA) 108 (90  Base) MCG/ACT inhaler, Inhale 2 puffs into the lungs every 6 (six) hours as needed for wheezing or shortness of breath., Disp: 1 Inhaler, Rfl: 2 .  cyclobenzaprine (FLEXERIL) 10 MG tablet, Take 1 tablet (10 mg total) by mouth 3 (three) times daily as needed for muscle spasms., Disp: 12 tablet, Rfl: 0 .  doxycycline (VIBRA-TABS) 100 MG tablet, Take 1 tablet (100 mg total) by mouth every 12 (twelve) hours., Disp: 24 tablet, Rfl: 0 .  famotidine (PEPCID) 20 MG tablet, Take 1 tablet (20 mg total) by mouth 2 (two) times daily., Disp: 60 tablet, Rfl: 1 .  feeding supplement, ENSURE ENLIVE, (ENSURE ENLIVE) LIQD, Take 237 mLs by mouth 3 (three) times daily with meals., Disp: 90 Bottle, Rfl: 0 .  ibuprofen (ADVIL,MOTRIN) 600 MG tablet, Take 1 tablet (600 mg total) by mouth every 8 (eight) hours as needed for mild pain or moderate pain., Disp: 15 tablet, Rfl: 0 .  LORazepam (ATIVAN) 0.5 MG tablet, Take 1 tablet (0.5 mg total) by mouth at bedtime as needed for anxiety., Disp: 5 tablet, Rfl: 0 .  magnesium oxide (MAG-OX) 400 (241.3 Mg) MG tablet, Take 1 tablet (400 mg total) by mouth at bedtime., Disp: 7 tablet, Rfl: 0 .  metroNIDAZOLE (FLAGYL) 500 MG tablet, Take 1 tablet (500 mg total) by mouth 3 (three) times daily., Disp: 18 tablet, Rfl: 0 .  oxyCODONE (OXY IR/ROXICODONE) 5 MG immediate release tablet, Take 1 tablet (5 mg total) by mouth every 4 (four) hours as needed for moderate pain or breakthrough pain., Disp: 12 tablet, Rfl: 0 .  potassium chloride (K-DUR) 10 MEQ tablet, Take 1 tablet (10 mEq total) by mouth daily., Disp:  3 tablet, Rfl: 0 .  promethazine (PHENERGAN) 25 MG tablet, Take 1 tablet (25 mg total) by mouth every 6 (six) hours as needed for nausea or vomiting., Disp: 20 tablet, Rfl: 0  Allergies Patient has no known allergies.  History reviewed. No pertinent family history.  Social History Social History  Substance Use Topics  . Smoking status: Current Every Day Smoker    Packs/day: 1.00      Types: Cigarettes  . Smokeless tobacco: Never Used  . Alcohol use No     Comment: occasionally    Review of Systems Constitutional: No fever/chills Eyes: No visual changes. ENT: No sore throat. Cardiovascular: Denies chest pain. Respiratory: Denies shortness of breath. Gastrointestinal:no vomiting.  No diarrhea.  No constipation. Genitourinary: as above. Musculoskeletal: Negative for back pain. Skin: Negative for rash. ____________________________________________   PHYSICAL EXAM:  VITAL SIGNS: ED Triage Vitals  Enc Vitals Group     BP 09/24/16 2012 115/66     Pulse Rate 09/24/16 2012 80     Resp 09/24/16 2012 16     Temp 09/24/16 2012 97.9 F (36.6 C)     Temp Source 09/24/16 2012 Oral     SpO2 09/24/16 2012 100 %     Weight 09/24/16 2013 125 lb (56.7 kg)     Height 09/24/16 2013 5\' 1"  (1.549 m)     Head Circumference --      Peak Flow --      Pain Score 09/24/16 2015 7     Pain Loc --      Pain Edu? --      Excl. in GC? --     Constitutional: Alert and oriented. Well appearing and in no acute distress. Eyes: Conjunctivae are normal. PERRL. EOMI. ENT      Head: Normocephalic and atraumatic.      Mouth/Throat: Mucous membranes are moist.Oropharynx non-erythematous. Cardiovascular: Normal rate, regular rhythm. Grossly normal heart sounds.  Good peripheral circulation. Respiratory: Normal respiratory effort without tachypnea nor retractions. Breath sounds are clear and equal bilaterally. No wheezes, rales, rhonchi. Gastrointestinal: Soft. Mild generalized suprapubic tenderness to palpation, abdomen otherwise soft and nontender. No distention. Normal Bowel sounds. No CVA tenderness. Musculoskeletal: Steady gait. No midline cervical, thoracic or lumbar tenderness to palpation. Neurologic:  Normal speech and language. Speech is normal. No gait instability.  Skin:  Skin is warm, dry and intact. No rash noted. Psychiatric: Mood and affect are normal. Speech and  behavior are normal. Patient exhibits appropriate insight and judgment   ___________________________________________   LABS (all labs ordered are listed, but only abnormal results are displayed)  Labs Reviewed - No data to display   PROCEDURES Procedures   INITIAL IMPRESSION / ASSESSMENT AND PLAN / ED COURSE  Pertinent labs & imaging results that were available during my care of the patient were reviewed by me and considered in my medical decision making (see chart for details).  Well-appearing patient. No acute distress. Mild generalized suprapubic tenderness to palpation. Reports positive pregnancy tests at home with onset of vaginal bleeding today, concern for active miscarriage. Patient has not been seen by OB/GYN for this pregnancy and has had no ultrasound. Discussed in detail with patient has concern for threatened miscarriage, recommends proceeding to emergency room at this time for further evaluation and ultrasound likely. Patient verbalized understanding and agrees with plan. Reports her friend will drive her to [facility].. Patient stable at time of discharge.  Tammy RN triage nurse at [facility] called and given report.  Discussed follow up with Primary care physician this week. Discussed follow up and return parameters including no resolution or any worsening concerns. Patient verbalized understanding and agreed to plan.   ____________________________________________   FINAL CLINICAL IMPRESSION(S) / ED DIAGNOSES  Final diagnoses:  Vaginal bleeding     Discharge Medication List as of 09/24/2016  8:38 PM      Note: This dictation was prepared with Dragon dictation along with smaller phrase technology. Any transcriptional errors that result from this process are unintentional.         Renford DillsLindsey Leticia Mcdiarmid, NP 09/24/16 2119

## 2016-09-24 NOTE — ED Notes (Signed)
poct pregnancy Positive 

## 2016-09-24 NOTE — ED Triage Notes (Addendum)
Patient started having abdominal pain last PM and spotting today. Patient reports a positive pregnancy test.

## 2016-09-25 ENCOUNTER — Telehealth: Payer: Self-pay | Admitting: Emergency Medicine

## 2016-09-25 LAB — POCT PREGNANCY, URINE: PREG TEST UR: POSITIVE — AB

## 2016-09-25 NOTE — Telephone Encounter (Signed)
Patient called asking for explanation of results from visit yesterday.   She can see them in the mychart.  She did not stay for exam, but had labs and an ultrasound done.  I explained the results, but stressed that she needs to be seen by a provider.  She does not have appt with [facility] for several weeks.  I told her not to wait that long.  I told her that it was not normal to have bleeding during pregnancy and that she needs an exam.

## 2018-10-22 IMAGING — US US OB TRANSVAGINAL
1 series · 14 of 28 positions shown · non-contrast
Comparison: None available.

CLINICAL DATA: Initial evaluation for acute light vaginal bleeding
for 2 days.

EXAM:
OBSTETRIC <14 WK US AND TRANSVAGINAL OB US
TECHNIQUE: Both transabdominal and transvaginal ultrasound examinations were
performed for complete evaluation of the gestation as well as the
maternal uterus, adnexal regions, and pelvic cul-de-sac.
Transvaginal technique was performed to assess early pregnancy.

[Series 1: us ob transvaginal · 0.25mm/px · 14 of 52 slices shown]
[im 2/52]
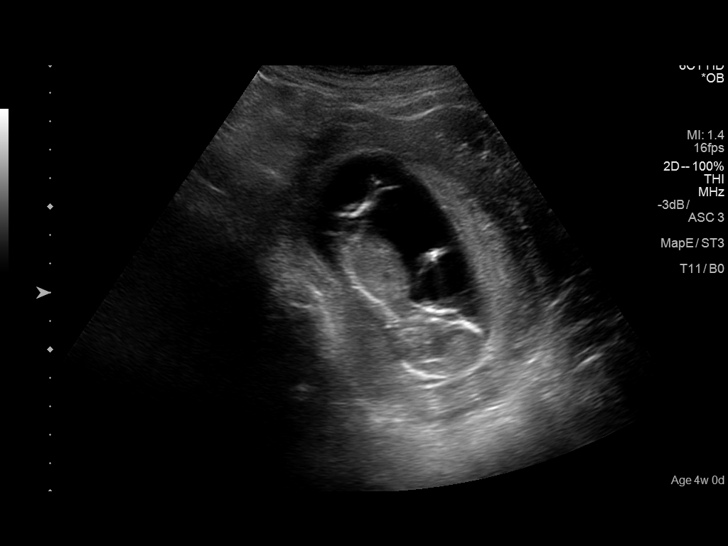
[im 6/52]
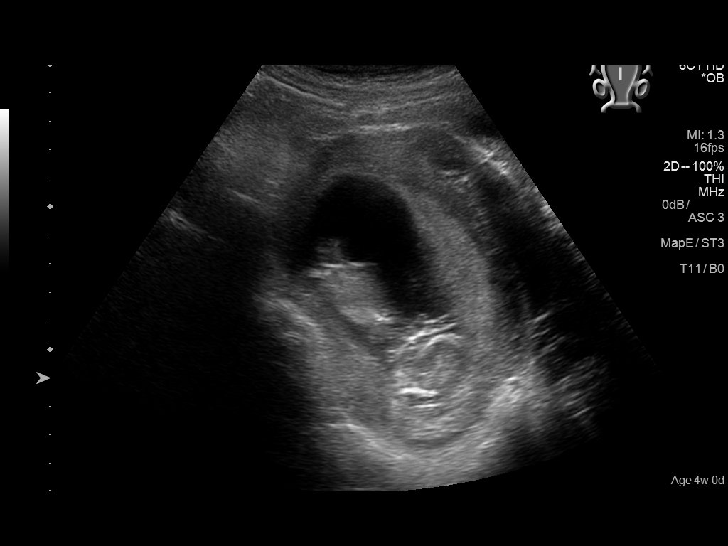
[im 10/52]
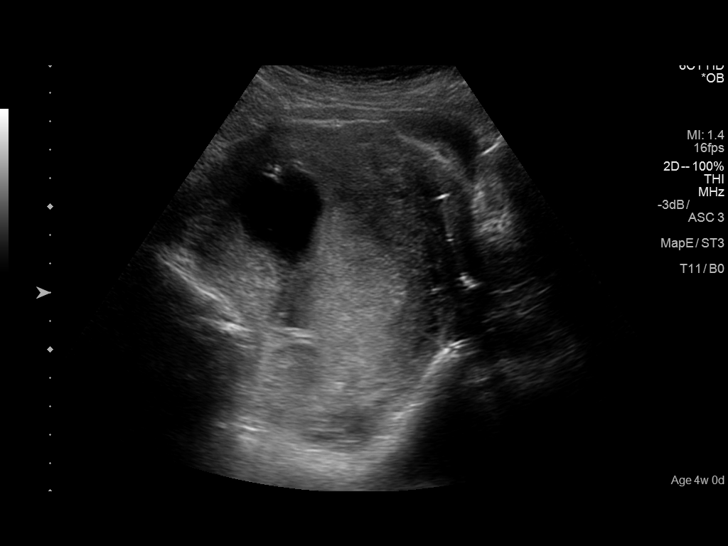
[im 14/52]
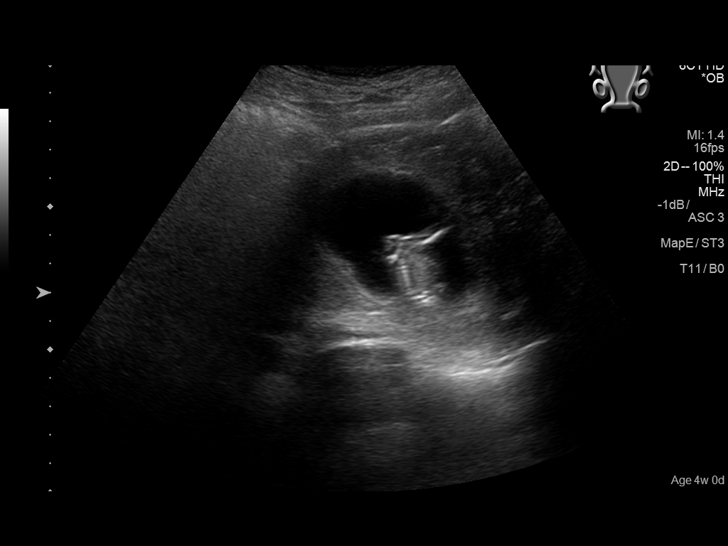
[im 18/52]
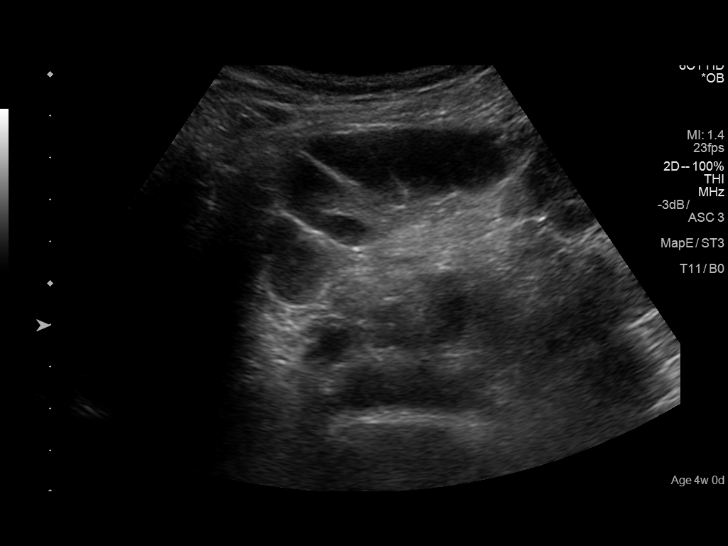
[im 21/52]
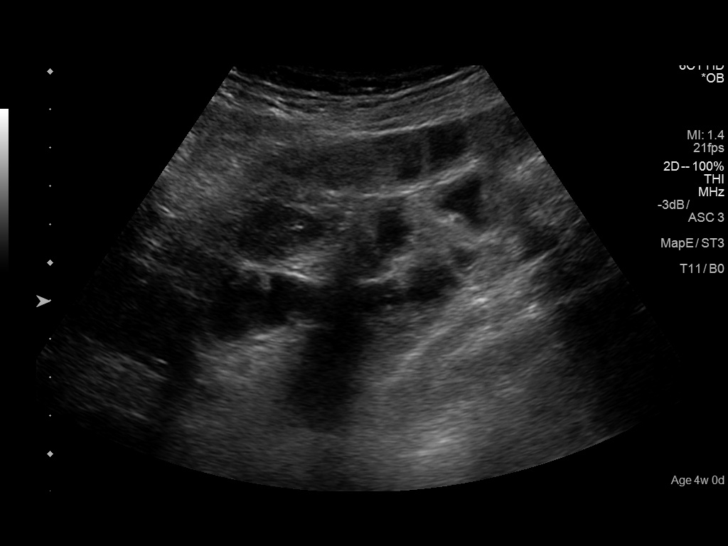
[im 25/52]
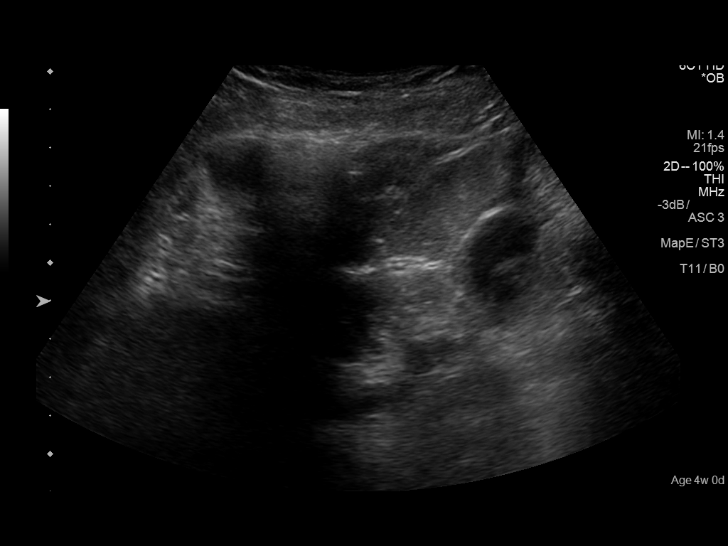
[im 29/52]
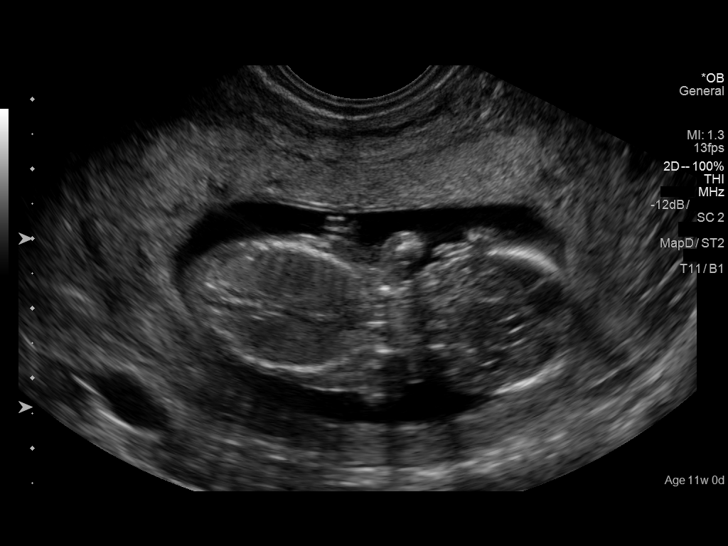
[im 33/52]
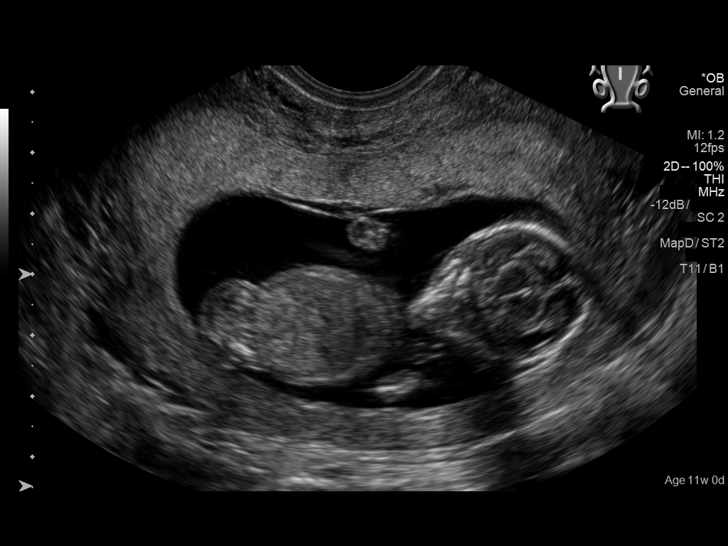
[im 36/52]
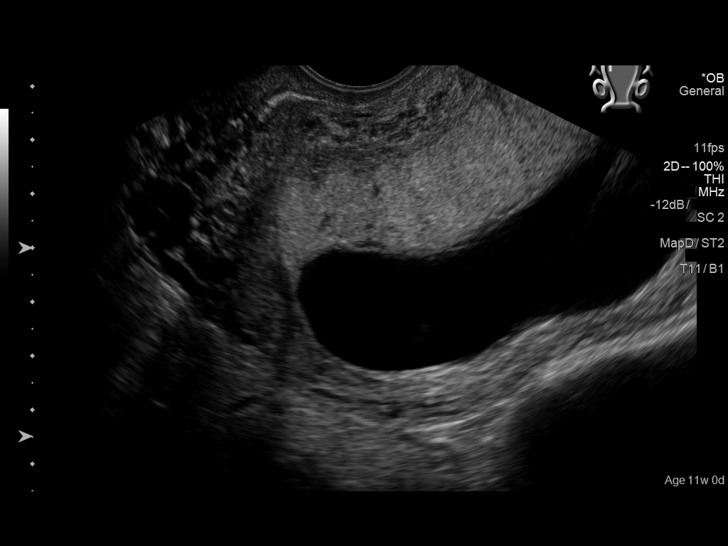
[im 40/52]
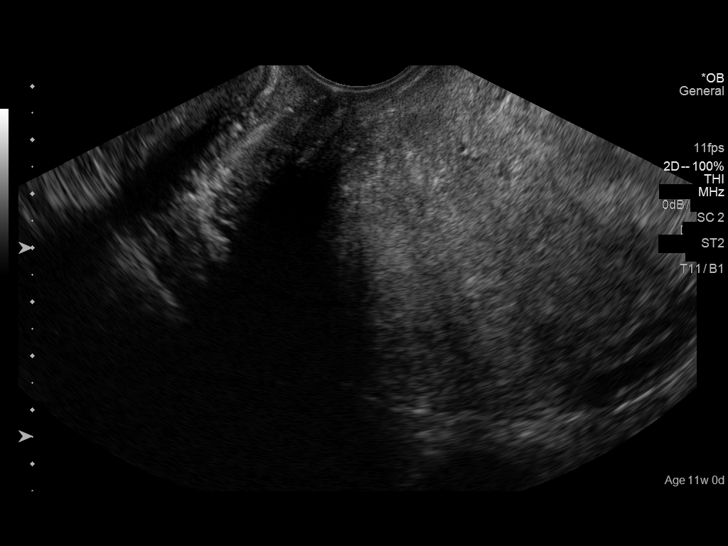
[im 44/52]
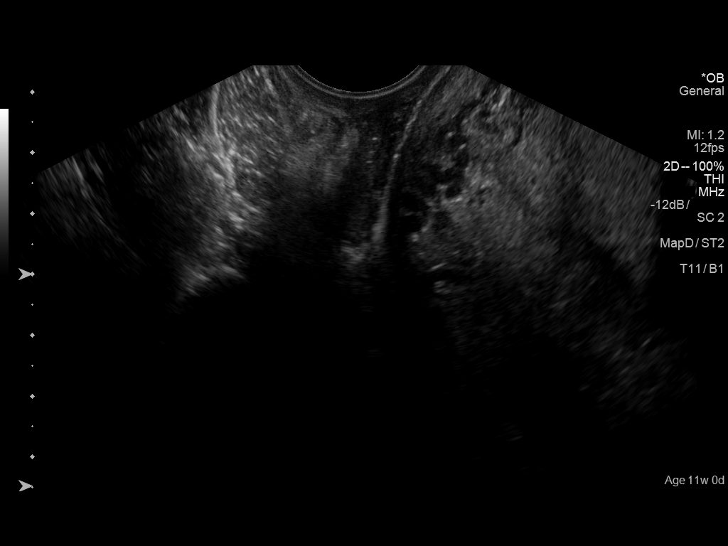
[im 48/52]
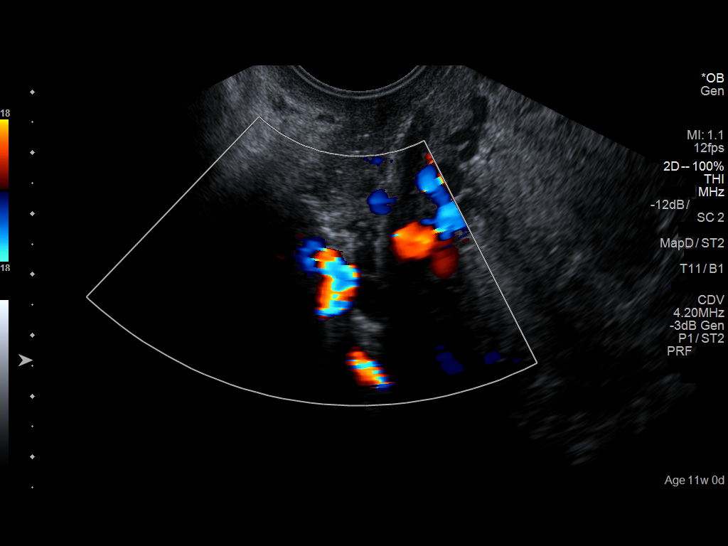
[im 52/52]
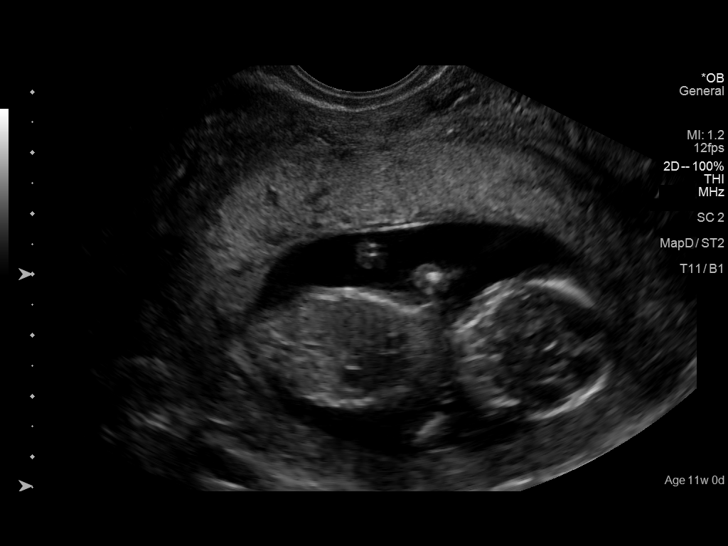

[14 of 28 positions shown; findings below may reference images not displayed]

FINDINGS: Intrauterine gestational sac: Single

Yolk sac:  Not visualized

Embryo:  Present

Cardiac Activity: Present

Heart Rate: 169  bpm

MSD:   mm    w     d

CRL:  62.5  mm   12 w   5 d                  US EDC: 04/03/2017

Subchorionic hemorrhage:  None visualized.

Maternal uterus/adnexae: Ovaries not visualized bilaterally. No free
fluid. No other acute abnormality within the adnexa.
IMPRESSION: 1. Single viable intrauterine pregnancy as above without
complication.
2. No other acute abnormality within the pelvis.

## 2018-12-30 ENCOUNTER — Telehealth: Payer: Self-pay | Admitting: Licensed Clinical Social Worker

## 2018-12-30 NOTE — Telephone Encounter (Signed)
Pt. Referred by Robin Martinique, RN [place]. LCSW called pt. To offer appt. Patient was schedule. LCSW also provided information for [facility].

## 2019-01-13 ENCOUNTER — Ambulatory Visit: Payer: Self-pay | Admitting: Licensed Clinical Social Worker

## 2020-06-28 ENCOUNTER — Encounter: Payer: Self-pay | Admitting: Emergency Medicine

## 2020-06-28 ENCOUNTER — Other Ambulatory Visit: Payer: Self-pay

## 2020-06-28 ENCOUNTER — Ambulatory Visit (INDEPENDENT_AMBULATORY_CARE_PROVIDER_SITE_OTHER): Payer: 59

## 2020-06-28 ENCOUNTER — Ambulatory Visit
Admission: EM | Admit: 2020-06-28 | Discharge: 2020-06-28 | Disposition: A | Discharge status: Home / Self Care | Payer: 59 | Attending: Physician Assistant | Admitting: Physician Assistant

## 2020-06-28 DIAGNOSIS — J449 Chronic obstructive pulmonary disease, unspecified: Secondary | ICD-10-CM

## 2020-06-28 DIAGNOSIS — U071 COVID-19: Secondary | ICD-10-CM | POA: Diagnosis present

## 2020-06-28 DIAGNOSIS — R0789 Other chest pain: Secondary | ICD-10-CM

## 2020-06-28 HISTORY — DX: Emphysema, unspecified: J43.9

## 2020-06-28 LAB — RESP PANEL BY RT-PCR (FLU A&B, COVID) ARPGX2
Influenza A by PCR: NEGATIVE
Influenza B by PCR: NEGATIVE
SARS Coronavirus 2 by RT PCR: POSITIVE — AB

## 2020-06-28 LAB — PREGNANCY, URINE: Preg Test, Ur: NEGATIVE

## 2020-06-28 MED ORDER — ALBUTEROL SULFATE HFA 108 (90 BASE) MCG/ACT IN AERS: 1.0000 | INHALATION_SPRAY | Freq: Four times a day (QID) | RESPIRATORY_TRACT | 2 refills | Status: DC | PRN

## 2020-06-28 MED ORDER — PREDNISONE 20 MG PO TABS: 40.0000 mg | ORAL_TABLET | Freq: Every day | ORAL | 0 refills | Status: AC

## 2020-06-28 MED ORDER — PREDNISONE 20 MG PO TABS: 40.0000 mg | ORAL_TABLET | Freq: Every day | ORAL | 0 refills | Status: DC

## 2020-06-28 NOTE — ED Triage Notes (Signed)
Patient c/o right side chest pain that started this morning. Denies any other symptoms.

## 2020-06-28 NOTE — ED Provider Notes (Signed)
[facility]    CSN: [id number] Arrival date & time: 06/28/20  1731      History   Chief Complaint Chief Complaint  Patient presents with  . Chest Pain    HPI Smith MIZANI DILDAY is a 44 y.o. female presenting for right-sided chest pain that started earlier this morning.  Denies any injury to the chest.  She says the pain woke her up.  Patient says that pressing on the right side of her chest makes the pain worse.  She took an aspirin which did not seem to get better.  She has not noticed anything that makes it better.  She says when she takes a large breath she has some increased pain.  Patient denies any associated fever, fatigue, body aches, cough, congestion, sore throat, headaches, weakness, palpitations, dizziness, leg swelling. Patient denies Covid exposure.  She is unvaccinated for COVID-19.  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  Patient says she has been out of her albuterol inhaler for the past month.  She would like a refill that today.  She has no other concerns.  HPI  Past Medical History:  Diagnosis Date  . COPD (chronic obstructive pulmonary disease) (HCC)   . Emphysema lung [facility])     Patient Active Problem List   Diagnosis Date Noted  . Sepsis (HCC) 01/04/2016  . Nausea and vomiting 01/04/2016  . UTI (lower urinary tract infection) 01/04/2016  . COPD (chronic obstructive pulmonary disease) (HCC) 01/04/2016    History reviewed. No pertinent surgical history.  OB History    Gravida  10   Para  4   Term  3   Preterm  1   AB  6   Living  4     SAB  2   IAB  4   Ectopic      Multiple      Live Births               Home Medications    Prior to Admission medications   Medication Sig Start Date End Date Taking? Authorizing Provider  albuterol (VENTOLIN HFA) 108 (90 Base) MCG/ACT inhaler Inhale 1-2 puffs into the lungs every 6 (six) hours as needed for wheezing or shortness of breath. 06/28/20 07/28/20  Eusebio Friendly  B, PA-C  cyclobenzaprine (FLEXERIL) 10 MG tablet Take 1 tablet (10 mg total) by mouth 3 (three) times daily as needed for muscle spasms. 01/06/16   Alford Highland, MD  doxycycline (VIBRA-TABS) 100 MG tablet Take 1 tablet (100 mg total) by mouth every 12 (twelve) hours. 01/06/16   Alford Highland, MD  famotidine (PEPCID) 20 MG tablet Take 1 tablet (20 mg total) by mouth 2 (two) times daily. 01/09/16   Emily Filbert, MD  feeding supplement, ENSURE ENLIVE, (ENSURE ENLIVE) LIQD Take 237 mLs by mouth 3 (three) times daily with meals. 01/06/16   Alford Highland, MD  ibuprofen (ADVIL,MOTRIN) 600 MG tablet Take 1 tablet (600 mg total) by mouth every 8 (eight) hours as needed for mild pain or moderate pain. 03/05/15   Renford Dills, NP  magnesium oxide (MAG-OX) 400 (241.3 Mg) MG tablet Take 1 tablet (400 mg total) by mouth at bedtime. 01/06/16   Alford Highland, MD  metroNIDAZOLE (FLAGYL) 500 MG tablet Take 1 tablet (500 mg total) by mouth 3 (three) times daily. 01/06/16   Alford Highland, MD  oxyCODONE (OXY IR/ROXICODONE) 5 MG immediate release tablet Take 1 tablet (5 mg total) by mouth every 4 (four) hours  as needed for moderate pain or breakthrough pain. 01/06/16   Alford HighlandWieting, Richard, MD  potassium chloride (K-DUR) 10 MEQ tablet Take 1 tablet (10 mEq total) by mouth daily. 01/06/16   Alford HighlandWieting, Richard, MD  predniSONE (DELTASONE) 20 MG tablet Take 2 tablets (40 mg total) by mouth daily for 5 days. 06/28/20 07/03/20  Shirlee LatchEaves, Annia Gomm B, PA-C  promethazine (PHENERGAN) 25 MG tablet Take 1 tablet (25 mg total) by mouth every 6 (six) hours as needed for nausea or vomiting. 01/09/16   Emily FilbertWilliams, Jonathan E, MD    Family History Family History  Problem Relation Age of Onset  . Hyperlipidemia Mother   . Hypertension Mother   . Hyperlipidemia Father   . Hypertension Father     Social History Social History   Tobacco Use  . Smoking status: Current Every Day Smoker    Packs/day: 1.00    Types: Cigarettes  .  Smokeless tobacco: Never Used  Substance Use Topics  . Alcohol use: Yes    Comment: occasionally  . Drug use: Yes    Types: Marijuana     Allergies   Patient has no known allergies.   Review of Systems Review of Systems  Constitutional: Negative for chills, diaphoresis, fatigue and fever.  HENT: Negative for congestion, ear pain, rhinorrhea, sinus pressure, sinus pain and sore throat.   Respiratory: Negative for cough and shortness of breath.   Cardiovascular: Positive for chest pain.  Gastrointestinal: Negative for abdominal pain, nausea and vomiting.  Musculoskeletal: Negative for arthralgias and myalgias.  Skin: Negative for rash.  Neurological: Negative for weakness and headaches.  Hematological: Negative for adenopathy.     Physical Exam Triage Vital Signs ED Triage Vitals  Enc Vitals Group     BP 06/28/20 1755 128/88     Pulse Rate 06/28/20 1755 82     Resp 06/28/20 1755 18     Temp 06/28/20 1755 98.5 F (36.9 C)     Temp Source 06/28/20 1755 Oral     SpO2 06/28/20 1755 100 %     Weight 06/28/20 1752 110 lb (49.9 kg)     Height 06/28/20 1752 5\' 4"  (1.626 m)     Head Circumference --      Peak Flow --      Pain Score 06/28/20 1752 8     Pain Loc --      Pain Edu? --      Excl. in GC? --    No data found.  Updated Vital Signs BP 128/88 (BP Location: Right Arm)   Pulse 82   Temp 98.5 F (36.9 C) (Oral)   Resp 18   Ht 5\' 4"  (1.626 m)   Wt 110 lb (49.9 kg)   LMP 05/26/2020   SpO2 100%   BMI 18.88 kg/m       Physical Exam Vitals and nursing note reviewed.  Constitutional:      General: She is not in acute distress.    Appearance: Normal appearance. She is not ill-appearing or toxic-appearing.  HENT:     Head: Normocephalic and atraumatic.     Nose: Nose normal.     Mouth/Throat:     Mouth: Mucous membranes are moist.     Pharynx: Oropharynx is clear.  Eyes:     General: No scleral icterus.       Right eye: No discharge.        Left eye: No  discharge.     Conjunctiva/sclera: Conjunctivae normal.  Cardiovascular:  Rate and Rhythm: Normal rate and regular rhythm.     Heart sounds: Normal heart sounds.  Pulmonary:     Effort: Pulmonary effort is normal. No respiratory distress.     Breath sounds: Normal breath sounds. No wheezing, rhonchi or rales.  Chest:     Chest wall: Tenderness (diffuse TTP anterior right chest) present.  Musculoskeletal:     Cervical back: Neck supple.  Skin:    General: Skin is dry.  Neurological:     General: No focal deficit present.     Mental Status: She is alert. Mental status is at baseline.     Motor: No weakness.     Gait: Gait normal.  Psychiatric:        Mood and Affect: Mood normal.        Behavior: Behavior normal.        Thought Content: Thought content normal.      UC Treatments / Results  Labs (all labs ordered are listed, but only abnormal results are displayed) Labs Reviewed  RESP PANEL BY RT-PCR (FLU A&B, COVID) ARPGX2 - Abnormal; Notable for the following components:      Result Value   SARS Coronavirus 2 by RT PCR POSITIVE (*)    All other components within normal limits  PREGNANCY, URINE    EKG   Radiology DG Chest 2 View  Result Date: 06/28/2020 CLINICAL DATA:  Right-sided chest pain starting this morning. EXAM: CHEST - 2 VIEW COMPARISON:  01/03/2016 FINDINGS: Mild hyperinflation. Heart size and pulmonary vascularity are normal. Lungs are clear. No pleural effusions. No pneumothorax. Mediastinal contours appear intact. IMPRESSION: No active cardiopulmonary disease. Electronically Signed   By: Burman Nieves M.D.   On: 06/28/2020 18:58    Procedures ED EKG  Date/Time: 06/28/2020 6:54 PM Performed by: Shirlee Latch, PA-C Authorized by: Shirlee Latch, PA-C   ECG reviewed by ED Physician in the absence of a cardiologist: yes   Previous ECG:    Previous ECG:  Unavailable Interpretation:    Interpretation: abnormal   Rate:    ECG rate:   76 Rhythm:    Rhythm: sinus rhythm   Ectopy:    Ectopy: none   QRS:    QRS axis:  Right ST segments:    ST segments:  Normal T waves:    T waves: normal   Comments:     Normal sinus rhythm. Right axis deviation. Bilateral atrial enlargement   (including critical care time)  Medications Ordered in UC Medications - No data to display  Initial Impression / Assessment and Plan / UC Course  I have reviewed the triage vital signs and the nursing notes.  Pertinent labs & imaging results that were available during my care of the patient were reviewed by me and considered in my medical decision making (see chart for details).   44 year old female presenting with onset of right chest pain today. She has no other associated symptoms. She does have tenderness to the right chest wall on exam. Chest clear to auscultation heart regular rate and rhythm. Patient is in no acute distress and all vital signs are within normal limits.  EKG performed today which reveals potential bilateral atrial involvement normal sinus rate and rhythm. Patient does have COPD. Chest x-ray also performed today which shows no acute cardiopulmonary disease.  Respiratory panel obtained and positive for COVID-19. CDC guidelines, isolation protocol, and ED precautions reviewed with patient. Patient referred to infusion center for MAB therapy, but advised against a  long list and patients are prioritized. Advised Tylenol for pain. Advised she can also take ibuprofen if no contraindications. Advised her if she starts to develop a cough or some exacerbation of her COPD or breathing, can try prednisone and I also refilled her albuterol inhaler. Again, ED precautions thoroughly reviewed with patient.   Final Clinical Impressions(s) / UC Diagnoses   Final diagnoses:  COVID-19  Chest wall pain  Chronic obstructive pulmonary disease, unspecified COPD type [facility])     Discharge Instructions     +COVID test. Normal chest xray. EKG  looks ok  COVID-19 INFECTION: The incubation period of COVID-19 is approximately 14 days after exposure, with most symptoms developing in roughly 4-5 days. Symptoms may range in severity from mild to critically severe. Roughly 80% of those infected will have mild symptoms. People of any age may become infected with COVID-19 and have the ability to transmit the virus. The most common symptoms include: fever, fatigue, cough, body aches, headaches, sore throat, nasal congestion, shortness of breath, nausea, vomiting, diarrhea, changes in smell and/or taste.    COURSE OF ILLNESS Some patients may begin with mild disease which can progress quickly into critical symptoms. If your symptoms are worsening please call ahead to the Emergency Department and proceed there for further treatment. Recovery time appears to be roughly 1-2 weeks for mild symptoms and 3-6 weeks for severe disease.   GO IMMEDIATELY TO ER FOR FEVER YOU ARE UNABLE TO GET DOWN WITH TYLENOL, BREATHING PROBLEMS, CHEST PAIN, FATIGUE, LETHARGY, INABILITY TO EAT OR DRINK, ETC  QUARANTINE AND ISOLATION: To help decrease the spread of COVID-19 please remain isolated if you have COVID infection or are highly suspected to have COVID infection. This means -stay home and isolate to one room in the home if you live with others. Do not share a bed or bathroom with others while ill, sanitize and wipe down all countertops and keep common areas clean and disinfected. You may discontinue isolation if you have a mild case and are asymptomatic 10 days after symptom onset as long as you have been fever free >24 hours without having to take Motrin or Tylenol. If your case is more severe (meaning you develop pneumonia or are admitted in the hospital), you may have to isolate longer.   If you have been in close contact (within 6 feet) of someone diagnosed with COVID 19, you are advised to quarantine in your home for 14 days as symptoms can develop anywhere from 2-14  days after exposure to the virus. If you develop symptoms, you  must isolate.  Most current guidelines for COVID after exposure -isolate 10 days if you ARE NOT tested for COVID as long as symptoms do not develop -isolate 7 days if you are tested and remain asymptomatic -You do not necessarily need to be tested for COVID if you have + exposure and        develop   symptoms. Just isolate at home x10 days from symptom onset During this global pandemic, CDC advises to practice social distancing, try to stay at least 66ft away from others at all times. Wear a face covering. Wash and sanitize your hands regularly and avoid going anywhere that is not necessary.  KEEP IN MIND THAT THE COVID TEST IS NOT 100% ACCURATE AND YOU SHOULD STILL DO EVERYTHING TO PREVENT POTENTIAL SPREAD OF VIRUS TO OTHERS (WEAR MASK, WEAR GLOVES, WASH HANDS AND SANITIZE REGULARLY). IF INITIAL TEST IS NEGATIVE, THIS MAY NOT MEAN YOU ARE DEFINITELY  NEGATIVE. MOST ACCURATE TESTING IS DONE 5-7 DAYS AFTER EXPOSURE.   It is not advised by CDC to get re-tested after receiving a positive COVID test since you can still test positive for weeks to months after you have already cleared the virus.   *If you have not been vaccinated for COVID, I strongly suggest you consider getting vaccinated as long as there are no contraindications.      ED Prescriptions    Medication Sig Dispense Auth. Provider   albuterol (VENTOLIN HFA) 108 (90 Base) MCG/ACT inhaler  (Status: Discontinued) Inhale 1-2 puffs into the lungs every 6 (six) hours as needed for wheezing or shortness of breath. 1 each Shirlee Latch, PA-C   predniSONE (DELTASONE) 20 MG tablet  (Status: Discontinued) Take 2 tablets (40 mg total) by mouth daily for 5 days. 10 tablet Eusebio Friendly B, PA-C   albuterol (VENTOLIN HFA) 108 (90 Base) MCG/ACT inhaler Inhale 1-2 puffs into the lungs every 6 (six) hours as needed for wheezing or shortness of breath. 1 each Shirlee Latch, PA-C   predniSONE  (DELTASONE) 20 MG tablet Take 2 tablets (40 mg total) by mouth daily for 5 days. 10 tablet Gareth Morgan     PDMP not reviewed this encounter.   Shirlee Latch, PA-C 06/30/20 1240

## 2020-06-28 NOTE — Discharge Instructions (Addendum)
+  COVID test. Normal chest xray. EKG looks ok  COVID-19 INFECTION: The incubation period of COVID-19 is approximately 14 days after exposure, with most symptoms developing in roughly 4-5 days. Symptoms may range in severity from mild to critically severe. Roughly 80% of those infected will have mild symptoms. People of any age may become infected with COVID-19 and have the ability to transmit the virus. The most common symptoms include: fever, fatigue, cough, body aches, headaches, sore throat, nasal congestion, shortness of breath, nausea, vomiting, diarrhea, changes in smell and/or taste.    COURSE OF ILLNESS Some patients may begin with mild disease which can progress quickly into critical symptoms. If your symptoms are worsening please call ahead to the Emergency Department and proceed there for further treatment. Recovery time appears to be roughly 1-2 weeks for mild symptoms and 3-6 weeks for severe disease.   GO IMMEDIATELY TO ER FOR FEVER YOU ARE UNABLE TO GET DOWN WITH TYLENOL, BREATHING PROBLEMS, CHEST PAIN, FATIGUE, LETHARGY, INABILITY TO EAT OR DRINK, ETC  QUARANTINE AND ISOLATION: To help decrease the spread of COVID-19 please remain isolated if you have COVID infection or are highly suspected to have COVID infection. This means -stay home and isolate to one room in the home if you live with others. Do not share a bed or bathroom with others while ill, sanitize and wipe down all countertops and keep common areas clean and disinfected. You may discontinue isolation if you have a mild case and are asymptomatic 10 days after symptom onset as long as you have been fever free >24 hours without having to take Motrin or Tylenol. If your case is more severe (meaning you develop pneumonia or are admitted in the hospital), you may have to isolate longer.   If you have been in close contact (within 6 feet) of someone diagnosed with COVID 19, you are advised to quarantine in your home for 14 days as  symptoms can develop anywhere from 2-14 days after exposure to the virus. If you develop symptoms, you  must isolate.  Most current guidelines for COVID after exposure -isolate 10 days if you ARE NOT tested for COVID as long as symptoms do not develop -isolate 7 days if you are tested and remain asymptomatic -You do not necessarily need to be tested for COVID if you have + exposure and        develop   symptoms. Just isolate at home x10 days from symptom onset During this global pandemic, CDC advises to practice social distancing, try to stay at least 60ft away from others at all times. Wear a face covering. Wash and sanitize your hands regularly and avoid going anywhere that is not necessary.  KEEP IN MIND THAT THE COVID TEST IS NOT 100% ACCURATE AND YOU SHOULD STILL DO EVERYTHING TO PREVENT POTENTIAL SPREAD OF VIRUS TO OTHERS (WEAR MASK, WEAR GLOVES, WASH HANDS AND SANITIZE REGULARLY). IF INITIAL TEST IS NEGATIVE, THIS MAY NOT MEAN YOU ARE DEFINITELY NEGATIVE. MOST ACCURATE TESTING IS DONE 5-7 DAYS AFTER EXPOSURE.   It is not advised by CDC to get re-tested after receiving a positive COVID test since you can still test positive for weeks to months after you have already cleared the virus.   *If you have not been vaccinated for COVID, I strongly suggest you consider getting vaccinated as long as there are no contraindications.

## 2020-11-16 ENCOUNTER — Ambulatory Visit
Admission: EM | Admit: 2020-11-16 | Discharge: 2020-11-16 | Disposition: A | Discharge status: Home / Self Care | Payer: 59 | Attending: Family Medicine | Admitting: Family Medicine

## 2020-11-16 DIAGNOSIS — K047 Periapical abscess without sinus: Secondary | ICD-10-CM | POA: Diagnosis not present

## 2020-11-16 DIAGNOSIS — K0889 Other specified disorders of teeth and supporting structures: Secondary | ICD-10-CM | POA: Diagnosis not present

## 2020-11-16 MED ORDER — HYDROCODONE-ACETAMINOPHEN 5-325 MG PO TABS: 1.0000 | ORAL_TABLET | Freq: Three times a day (TID) | ORAL | 0 refills | Status: DC | PRN

## 2020-11-16 MED ORDER — AMOXICILLIN-POT CLAVULANATE 875-125 MG PO TABS: 1.0000 | ORAL_TABLET | Freq: Two times a day (BID) | ORAL | 0 refills | Status: DC

## 2020-11-16 NOTE — ED Provider Notes (Signed)
[facility]    CSN: [id number] Arrival date & time: 11/16/20  1340      History   Chief Complaint Chief Complaint  Patient presents with  . Dental Pain   HPI  45 year old female presents with dental pain.  Patient reports that she has developed dental pain as of 3 days ago.  Her pain is 10/10 in severity.  Located at the left upper molars.  She states that she has contacted multiple dentists and no one can see her today.  She has an appointment on Monday.  Her pain is severe and has not responded to over-the-counter analgesics.  No fever.  No reports of facial swelling.  No other complaints.  Past Medical History:  Diagnosis Date  . COPD (chronic obstructive pulmonary disease) (HCC)   . Emphysema lung [facility])     Patient Active Problem List   Diagnosis Date Noted  . Sepsis (HCC) 01/04/2016  . Nausea and vomiting 01/04/2016  . UTI (lower urinary tract infection) 01/04/2016  . COPD (chronic obstructive pulmonary disease) (HCC) 01/04/2016    Home Medications    Prior to Admission medications   Medication Sig Start Date End Date Taking? Authorizing Provider  amoxicillin-clavulanate (AUGMENTIN) 875-125 MG tablet Take 1 tablet by mouth 2 (two) times daily. 11/16/20  Yes Manraj Yeo G, DO  HYDROcodone-acetaminophen (NORCO/VICODIN) 5-325 MG tablet Take 1 tablet by mouth every 8 (eight) hours as needed for moderate pain or severe pain. 11/16/20  Yes Ruth Kovich G, DO  albuterol (VENTOLIN HFA) 108 (90 Base) MCG/ACT inhaler Inhale 1-2 puffs into the lungs every 6 (six) hours as needed for wheezing or shortness of breath. 06/28/20 07/28/20  Shirlee Latch, PA-C  metroNIDAZOLE (FLAGYL) 500 MG tablet Take 1 tablet (500 mg total) by mouth 3 (three) times daily. 01/06/16   Alford Highland, MD  famotidine (PEPCID) 20 MG tablet Take 1 tablet (20 mg total) by mouth 2 (two) times daily. 01/09/16 11/16/20  Emily Filbert, MD  potassium chloride (K-DUR) 10 MEQ tablet Take 1 tablet  (10 mEq total) by mouth daily. 01/06/16 11/16/20  Alford Highland, MD  promethazine (PHENERGAN) 25 MG tablet Take 1 tablet (25 mg total) by mouth every 6 (six) hours as needed for nausea or vomiting. 01/09/16 11/16/20  Emily Filbert, MD    Family History Family History  Problem Relation Age of Onset  . Hyperlipidemia Mother   . Hypertension Mother   . Hyperlipidemia Father   . Hypertension Father     Social History Social History   Tobacco Use  . Smoking status: Current Every Day Smoker    Packs/day: 1.00    Types: Cigarettes  . Smokeless tobacco: Never Used  Substance Use Topics  . Alcohol use: Yes    Comment: occasionally  . Drug use: Yes    Types: Marijuana     Allergies   Patient has no known allergies.   Review of Systems Review of Systems  Constitutional: Negative.   HENT: Positive for dental problem.    Physical Exam Triage Vital Signs ED Triage Vitals  Enc Vitals Group     BP 11/16/20 1348 (!) 146/98     Pulse Rate 11/16/20 1348 68     Resp 11/16/20 1348 18     Temp 11/16/20 1350 98.8 F (37.1 C)     Temp Source 11/16/20 1348 Oral     SpO2 11/16/20 1348 97 %     Weight --      Height --  Head Circumference --      Peak Flow --      Pain Score 11/16/20 1346 10     Pain Loc --      Pain Edu? --      Excl. in GC? --    Updated Vital Signs BP (!) 146/98 (BP Location: Left Arm)   Pulse 68   Temp 98.8 F (37.1 C)   Resp 18   SpO2 97%   Visual Acuity Right Eye Distance:   Left Eye Distance:   Bilateral Distance:    Right Eye Near:   Left Eye Near:    Bilateral Near:     Physical Exam Constitutional:      Comments: Appears in distress secondary to pain.  HENT:     Head: Normocephalic and atraumatic.     Mouth/Throat:      Comments: Multiple dental caries noted.  The labeled teeth are very decayed. Eyes:     General:        Right eye: No discharge.        Left eye: No discharge.     Conjunctiva/sclera: Conjunctivae normal.   Pulmonary:     Effort: Pulmonary effort is normal. No respiratory distress.  Neurological:     Mental Status: She is alert.  Psychiatric:        Mood and Affect: Mood normal.        Behavior: Behavior normal.    UC Treatments / Results  Labs (all labs ordered are listed, but only abnormal results are displayed) Labs Reviewed - No data to display  EKG   Radiology No results found.  Procedures Procedures (including critical care time)  Medications Ordered in UC Medications - No data to display  Initial Impression / Assessment and Plan / UC Course  I have reviewed the triage vital signs and the nursing notes.  Pertinent labs & imaging results that were available during my care of the patient were reviewed by me and considered in my medical decision making (see chart for details).    45 year old female presents with dental pain and concern for dental infection.  Treating with Augmentin.  Hydrocodone as needed for pain.  Patient has a dental appointment on Monday.  Final Clinical Impressions(s) / UC Diagnoses   Final diagnoses:  Pain, dental  Dental infection   Discharge Instructions   None    ED Prescriptions    Medication Sig Dispense Auth. Provider   HYDROcodone-acetaminophen (NORCO/VICODIN) 5-325 MG tablet Take 1 tablet by mouth every 8 (eight) hours as needed for moderate pain or severe pain. 15 tablet Janelie Goltz G, DO   amoxicillin-clavulanate (AUGMENTIN) 875-125 MG tablet Take 1 tablet by mouth 2 (two) times daily. 14 tablet Maury, Goldsmith G, DO     I have reviewed the PDMP during this encounter.   Tommie Sams, DO 11/16/20 1500

## 2020-11-16 NOTE — ED Triage Notes (Signed)
Pt is present today with dental on the left side of her mouth. Pt states that her pain started three days ago.

## 2021-01-21 ENCOUNTER — Ambulatory Visit
Admission: RE | Admit: 2021-01-21 | Discharge: 2021-01-21 | Disposition: A | Discharge status: Home / Self Care | Payer: 59 | Source: Ambulatory Visit | Attending: Family Medicine | Admitting: Family Medicine

## 2021-01-21 ENCOUNTER — Other Ambulatory Visit: Payer: Self-pay

## 2021-01-21 VITALS — BP 123/90 | HR 71 | Temp 98.2°F | Resp 18 | Ht 64.5 in | Wt 110.0 lb

## 2021-01-21 DIAGNOSIS — N898 Other specified noninflammatory disorders of vagina: Secondary | ICD-10-CM | POA: Diagnosis present

## 2021-01-21 DIAGNOSIS — R1030 Lower abdominal pain, unspecified: Secondary | ICD-10-CM | POA: Diagnosis present

## 2021-01-21 LAB — POCT URINALYSIS DIP (DEVICE)
Bilirubin Urine: NEGATIVE
Glucose, UA: NEGATIVE mg/dL
Hgb urine dipstick: NEGATIVE
Ketones, ur: NEGATIVE mg/dL
Nitrite: NEGATIVE
Protein, ur: NEGATIVE mg/dL
Specific Gravity, Urine: <=1.005 (ref 1.005–1.030)
Urobilinogen, UA: 0.2 mg/dL (ref 0.0–1.0)
pH: 6 (ref 5.0–8.0)

## 2021-01-21 MED ORDER — FLUCONAZOLE 150 MG PO TABS: 150.0000 mg | ORAL_TABLET | Freq: Once | ORAL | 0 refills | Status: AC

## 2021-01-21 MED ORDER — ALBUTEROL SULFATE HFA 108 (90 BASE) MCG/ACT IN AERS: 1.0000 | INHALATION_SPRAY | Freq: Four times a day (QID) | RESPIRATORY_TRACT | 2 refills | Status: AC | PRN

## 2021-01-21 NOTE — Discharge Instructions (Addendum)
Awaiting testing results from the swab.  Medication as directed.  If you worsen, go to the ER.  Take care  Dr. Adriana Simas

## 2021-01-21 NOTE — ED Provider Notes (Signed)
[facility]    CSN: [id number] Arrival date & time: 01/21/21  1810      History   Chief Complaint Chief Complaint  Patient presents with   Abdominal Pain   Vaginal Discharge    HPI 45 year old female presents with the above complaints.  Patient reports lower abdominal pain for the past 4 days.  Patient reports that she is also experiencing vaginal discharge.  She reports the discharge is white.  Denies vaginal itching.  Patient denies dysuria.  She does report urinary frequency.  Patient states that she is in a monogamous relationship but would like STD testing today as well.  No fever.  No relieving factors.  She states that her pain is 9/10 in severity.  No other complaints.  Past Medical History:  Diagnosis Date   COPD (chronic obstructive pulmonary disease) (HCC)    Emphysema lung (HCC)     Patient Active Problem List   Diagnosis Date Noted   Sepsis (HCC) 01/04/2016   Nausea and vomiting 01/04/2016   UTI (lower urinary tract infection) 01/04/2016   COPD (chronic obstructive pulmonary disease) (HCC) 01/04/2016    History reviewed. No pertinent surgical history.  OB History     Gravida  10   Para  4   Term  3   Preterm  1   AB  6   Living  4      SAB  2   IAB  4   Ectopic      Multiple      Live Births               Home Medications    Prior to Admission medications   Medication Sig Start Date End Date Taking? Authorizing Provider  fluconazole (DIFLUCAN) 150 MG tablet Take 1 tablet (150 mg total) by mouth once for 1 dose. Repeat dose in 72 hours. 01/21/21 01/21/21 Yes Yoceline Bazar G, DO  albuterol (VENTOLIN HFA) 108 (90 Base) MCG/ACT inhaler Inhale 1-2 puffs into the lungs every 6 (six) hours as needed for wheezing or shortness of breath. 01/21/21 02/20/21  Tommie Sams, DO  famotidine (PEPCID) 20 MG tablet Take 1 tablet (20 mg total) by mouth 2 (two) times daily. 01/09/16 11/16/20  Emily Filbert, MD  potassium chloride  (K-DUR) 10 MEQ tablet Take 1 tablet (10 mEq total) by mouth daily. 01/06/16 11/16/20  Alford Highland, MD  promethazine (PHENERGAN) 25 MG tablet Take 1 tablet (25 mg total) by mouth every 6 (six) hours as needed for nausea or vomiting. 01/09/16 11/16/20  Emily Filbert, MD    Family History Family History  Problem Relation Age of Onset   Hyperlipidemia Mother    Hypertension Mother    Hyperlipidemia Father    Hypertension Father     Social History Social History   Tobacco Use   Smoking status: Every Day    Packs/day: 1.00    Types: Cigarettes   Smokeless tobacco: Never  Vaping Use   Vaping Use: Never used  Substance Use Topics   Alcohol use: Yes    Comment: occasionally   Drug use: Yes    Types: Marijuana     Allergies   Patient has no known allergies.   Review of Systems Review of Systems  Constitutional:  Negative for fever.  Gastrointestinal:  Positive for abdominal pain.  Genitourinary:  Positive for frequency and vaginal discharge.   Physical Exam Triage Vital Signs ED Triage Vitals  Enc Vitals Group  BP 01/21/21 1843 123/90     Pulse Rate 01/21/21 1843 71     Resp 01/21/21 1843 18     Temp 01/21/21 1843 98.2 F (36.8 C)     Temp Source 01/21/21 1843 Oral     SpO2 01/21/21 1843 99 %     Weight 01/21/21 1841 110 lb (49.9 kg)     Height 01/21/21 1841 5' 4.5" (1.638 m)     Head Circumference --      Peak Flow --      Pain Score 01/21/21 1841 9     Pain Loc --      Pain Edu? --      Excl. in [place]? --    Updated Vital Signs BP 123/90 (BP Location: Left Arm)   Pulse 71   Temp 98.2 F (36.8 C) (Oral)   Resp 18   Ht 5' 4.5" (1.638 m)   Wt 49.9 kg   LMP 12/24/2020   SpO2 99%   BMI 18.59 kg/m   Visual Acuity Right Eye Distance:   Left Eye Distance:   Bilateral Distance:    Right Eye Near:   Left Eye Near:    Bilateral Near:     Physical Exam Constitutional:      General: She is not in acute distress.    Appearance: She is not  ill-appearing.  HENT:     Head: Normocephalic and atraumatic.  Eyes:     General:        Right eye: No discharge.        Left eye: No discharge.     Conjunctiva/sclera: Conjunctivae normal.  Cardiovascular:     Rate and Rhythm: Normal rate and regular rhythm.  Pulmonary:     Effort: Pulmonary effort is normal.     Breath sounds: Normal breath sounds. No wheezing, rhonchi or rales.  Abdominal:     Palpations: Abdomen is soft.     Comments: Mild tenderness in the suprapubic region.  Neurological:     Mental Status: She is alert.  Psychiatric:        Mood and Affect: Mood normal.        Behavior: Behavior normal.     UC Treatments / Results  Labs (all labs ordered are listed, but only abnormal results are displayed) Labs Reviewed  POCT URINALYSIS DIP (DEVICE) - Abnormal; Notable for the following components:      Result Value   Leukocytes,Ua SMALL (*)    All other components within normal limits  POC URINE PREG, ED  POCT URINALYSIS DIPSTICK, ED / UC  CERVICOVAGINAL ANCILLARY ONLY    EKG   Radiology No results found.  Procedures Procedures (including critical care time)  Medications Ordered in UC Medications - No data to display  Initial Impression / Assessment and Plan / UC Course  I have reviewed the triage vital signs and the nursing notes.  Pertinent labs & imaging results that were available during my care of the patient were reviewed by me and considered in my medical decision making (see chart for details).    45 year old female presents with vaginal discharge and lower abdominal pain.  Urinalysis not consistent with UTI.  Urine pregnancy test negative.  Awaiting Aptima test results.  Treating empirically for yeast vaginitis while awaiting results.  Supportive care.  Final Clinical Impressions(s) / UC Diagnoses   Final diagnoses:  Vaginal discharge  Lower abdominal pain     Discharge Instructions      Awaiting testing results  from the  swab.  Medication as directed.  If you worsen, go to the ER.  Take care  Dr. Adriana Simas    ED Prescriptions     Medication Sig Dispense Auth. Provider   fluconazole (DIFLUCAN) 150 MG tablet Take 1 tablet (150 mg total) by mouth once for 1 dose. Repeat dose in 72 hours. 2 tablet Tameya Kuznia G, DO   albuterol (VENTOLIN HFA) 108 (90 Base) MCG/ACT inhaler Inhale 1-2 puffs into the lungs every 6 (six) hours as needed for wheezing or shortness of breath. 1 each Tommie Sams, DO      PDMP not reviewed this encounter.   Tommie Sams, [place] 01/21/21 2028

## 2021-01-21 NOTE — ED Triage Notes (Signed)
Pt c/o lower abdominal pain for about 4 days along with white vaginal discharge. Pt denies itching, burning, irritation, dysuria or other symptoms. Pt would also like STD testing.

## 2021-01-22 LAB — CERVICOVAGINAL ANCILLARY ONLY
Bacterial Vaginitis (gardnerella): POSITIVE — AB
Candida Glabrata: NEGATIVE
Candida Vaginitis: NEGATIVE
Chlamydia: NEGATIVE
Comment: NEGATIVE
Comment: NEGATIVE
Comment: NEGATIVE
Comment: NEGATIVE
Comment: NEGATIVE
Comment: NORMAL
Neisseria Gonorrhea: NEGATIVE
Trichomonas: POSITIVE — AB

## 2021-01-23 ENCOUNTER — Telehealth (HOSPITAL_COMMUNITY): Payer: Self-pay | Admitting: Emergency Medicine

## 2021-01-23 MED ORDER — METRONIDAZOLE 500 MG PO TABS: 500.0000 mg | ORAL_TABLET | Freq: Two times a day (BID) | ORAL | 0 refills | Status: DC

## 2021-05-05 ENCOUNTER — Other Ambulatory Visit: Payer: Self-pay | Admitting: Family Medicine

## 2021-07-07 ENCOUNTER — Other Ambulatory Visit: Payer: Self-pay | Admitting: Family Medicine

## 2021-07-12 ENCOUNTER — Ambulatory Visit: Payer: Self-pay

## 2021-08-22 ENCOUNTER — Other Ambulatory Visit: Payer: Self-pay | Admitting: Family Medicine

## 2022-04-23 ENCOUNTER — Ambulatory Visit
Admission: EM | Admit: 2022-04-23 | Discharge: 2022-04-23 | Disposition: A | Discharge status: Home / Self Care | Payer: Medicaid Other

## 2022-04-23 ENCOUNTER — Encounter: Payer: Self-pay | Admitting: Emergency Medicine

## 2022-04-23 DIAGNOSIS — H1032 Unspecified acute conjunctivitis, left eye: Secondary | ICD-10-CM | POA: Diagnosis not present

## 2022-04-23 MED ORDER — MOXIFLOXACIN HCL 0.5 % OP SOLN: 1.0000 [drp] | Freq: Three times a day (TID) | OPHTHALMIC | 0 refills | Status: AC

## 2022-04-23 NOTE — ED Triage Notes (Signed)
Pt presents with bilateral eye irritation, light sensitivity and drainage since yesterday.

## 2022-04-23 NOTE — ED Provider Notes (Signed)
[facility]    CSN: [id number] Arrival date & time: 04/23/22  1941      History   Chief Complaint Chief Complaint  Patient presents with   Eye Problem    Eye keeps watering and hurting - Entered by patient    HPI Lindsey Campos is a 46 y.o. female.   HPI  46 year old female here for evaluation of left eye itching, light sensitivity, and watery discharge since yesterday.  She denies any injury and states she does not wear contacts.  She denies any known sick contacts.  Past Medical History:  Diagnosis Date   COPD (chronic obstructive pulmonary disease) (HCC)    Emphysema lung (HCC)     Patient Active Problem List   Diagnosis Date Noted   Sepsis (HCC) 01/04/2016   Nausea and vomiting 01/04/2016   UTI (lower urinary tract infection) 01/04/2016   COPD (chronic obstructive pulmonary disease) (HCC) 01/04/2016    History reviewed. No pertinent surgical history.  OB History     Gravida  10   Para  4   Term  3   Preterm  1   AB  6   Living  4      SAB  2   IAB  4   Ectopic      Multiple      Live Births               Home Medications    Prior to Admission medications   Medication Sig Start Date End Date Taking? Authorizing Provider  DULoxetine (CYMBALTA) 30 MG capsule Take 1 capsule by mouth daily. 12/26/21 12/26/22 Yes [provider]  escitalopram (LEXAPRO) 5 MG tablet Take by mouth. 01/05/19  Yes [provider]  moxifloxacin (VIGAMOX) 0.5 % ophthalmic solution Place 1 drop into both eyes 3 (three) times daily for 7 days. 04/23/22 04/30/22 Yes Becky Augusta, NP  albuterol (VENTOLIN HFA) 108 (90 Base) MCG/ACT inhaler Inhale 1-2 puffs into the lungs every 6 (six) hours as needed for wheezing or shortness of breath. 01/21/21 02/20/21  Tommie Sams, DO  metroNIDAZOLE (FLAGYL) 500 MG tablet Take 1 tablet (500 mg total) by mouth 2 (two) times daily. 01/23/21   Merrilee Jansky, MD  TRELEGY ELLIPTA 100-62.5-25 MCG/ACT AEPB  Inhale 1 puff into the lungs daily. 04/23/22   [provider]  famotidine (PEPCID) 20 MG tablet Take 1 tablet (20 mg total) by mouth 2 (two) times daily. 01/09/16 11/16/20  Emily Filbert, MD  potassium chloride (K-DUR) 10 MEQ tablet Take 1 tablet (10 mEq total) by mouth daily. 01/06/16 11/16/20  Alford Highland, MD  promethazine (PHENERGAN) 25 MG tablet Take 1 tablet (25 mg total) by mouth every 6 (six) hours as needed for nausea or vomiting. 01/09/16 11/16/20  Emily Filbert, MD    Family History Family History  Problem Relation Age of Onset   Hyperlipidemia Mother    Hypertension Mother    Hyperlipidemia Father    Hypertension Father     Social History Social History   Tobacco Use   Smoking status: Every Day    Packs/day: 1.00    Types: Cigarettes   Smokeless tobacco: Never  Vaping Use   Vaping Use: Never used  Substance Use Topics   Alcohol use: Yes    Comment: occasionally   Drug use: Yes    Types: Marijuana     Allergies   Patient has no known allergies.   Review of Systems Review of  Systems  Eyes:  Positive for photophobia, discharge, redness, itching and visual disturbance.     Physical Exam Triage Vital Signs ED Triage Vitals [04/23/22 1946]  Enc Vitals Group     BP 100/71     Pulse Rate 77     Resp 16     Temp 98.6 F (37 C)     Temp Source Oral     SpO2 99 %     Weight      Height      Head Circumference      Peak Flow      Pain Score 7     Pain Loc      Pain Edu?      Excl. in [place]?    No data found.  Updated Vital Signs BP 100/71 (BP Location: Left Arm)   Pulse 77   Temp 98.6 F (37 C) (Oral)   Resp 16   LMP  (LMP Unknown)   SpO2 99%   Visual Acuity Right Eye Distance:   Left Eye Distance:   Bilateral Distance:    Right Eye Near:   Left Eye Near:    Bilateral Near:     Physical Exam Vitals and nursing note reviewed.  Constitutional:      Appearance: Normal appearance. She is not ill-appearing.  HENT:      Head: Normocephalic and atraumatic.  Eyes:     General:        Left eye: Discharge present.    Extraocular Movements: Extraocular movements intact.     Pupils: Pupils are equal, round, and reactive to light.  Skin:    General: Skin is warm and dry.     Capillary Refill: Capillary refill takes less than 2 seconds.  Neurological:     General: No focal deficit present.     Mental Status: She is alert and oriented to person, place, and time.      UC Treatments / Results  Labs (all labs ordered are listed, but only abnormal results are displayed) Labs Reviewed - No data to display  EKG   Radiology No results found.  Procedures Procedures (including critical care time)  Medications Ordered in UC Medications - No data to display  Initial Impression / Assessment and Plan / UC Course  I have reviewed the triage vital signs and the nursing notes.  Pertinent labs & imaging results that were available during my care of the patient were reviewed by me and considered in my medical decision making (see chart for details).   Patient is a nontoxic-appearing 47 year old female here for evaluation of left eye itching, redness, and watery discharge that started yesterday.  She is also endorsing blurry vision in her left eye.  On exam patient has erythema and injection of her bulbar and labral conjunctiva of her left eye.  There is no edema or erythema of the eyelids or periorbital region.  Her pupils equal round reactive and she has normal red light reflex in the left eye.  EOM is intact.  Patient does have watery discharge from her left eye but no mucopurulence noted.  I will treat the patient for conjunctivitis with Vigamox 3 times daily x7 days.  Infection control precautions reviewed.  Return precautions reviewed.  Caregiver work note provided.   Final Clinical Impressions(s) / UC Diagnoses   Final diagnoses:  Acute conjunctivitis of left eye, unspecified acute conjunctivitis type      Discharge Instructions      Instill 1 drop of  Vigamox in each eye every 8 hours for the next 7 days for treatment of your conjunctivitis.  Avoid touching your eyes as much as possible.  Wipe down all surfaces, countertops, and doorknobs after the first and second 24 hours on eyedrops.  Wash her face with a clean wash rag to remove any drainage and use a different portion of the wash rag to clean each eye so as to not reinfect yourself.  Return for reevaluation for any new or worsening symptoms.      ED Prescriptions     Medication Sig Dispense Auth. Provider   moxifloxacin (VIGAMOX) 0.5 % ophthalmic solution Place 1 drop into both eyes 3 (three) times daily for 7 days. 3 mL Becky Augusta, NP      PDMP not reviewed this encounter.   Becky Augusta, NP 04/23/22 2012

## 2022-04-23 NOTE — Discharge Instructions (Signed)
Instill 1 drop of Vigamox in each eye every 8 hours for the next 7 days for treatment of your conjunctivitis.  Avoid touching your eyes as much as possible.  Wipe down all surfaces, countertops, and doorknobs after the first and second 24 hours on eyedrops.  Wash her face with a clean wash rag to remove any drainage and use a different portion of the wash rag to clean each eye so as to not reinfect yourself.  Return for reevaluation for any new or worsening symptoms.  

## 2022-05-15 ENCOUNTER — Encounter: Payer: Self-pay | Admitting: Emergency Medicine

## 2022-05-15 ENCOUNTER — Ambulatory Visit (INDEPENDENT_AMBULATORY_CARE_PROVIDER_SITE_OTHER): Payer: Medicaid Other

## 2022-05-15 ENCOUNTER — Ambulatory Visit
Admission: EM | Admit: 2022-05-15 | Discharge: 2022-05-15 | Disposition: A | Discharge status: Home / Self Care | Payer: Medicaid Other | Attending: Physician Assistant | Admitting: Physician Assistant

## 2022-05-15 DIAGNOSIS — R0789 Other chest pain: Secondary | ICD-10-CM

## 2022-05-15 DIAGNOSIS — R52 Pain, unspecified: Secondary | ICD-10-CM | POA: Diagnosis present

## 2022-05-15 DIAGNOSIS — B9689 Other specified bacterial agents as the cause of diseases classified elsewhere: Secondary | ICD-10-CM

## 2022-05-15 DIAGNOSIS — R1030 Lower abdominal pain, unspecified: Secondary | ICD-10-CM | POA: Insufficient documentation

## 2022-05-15 DIAGNOSIS — R079 Chest pain, unspecified: Secondary | ICD-10-CM

## 2022-05-15 DIAGNOSIS — N760 Acute vaginitis: Secondary | ICD-10-CM | POA: Diagnosis not present

## 2022-05-15 DIAGNOSIS — A599 Trichomoniasis, unspecified: Secondary | ICD-10-CM

## 2022-05-15 LAB — URINALYSIS, ROUTINE W REFLEX MICROSCOPIC
Glucose, UA: NEGATIVE mg/dL
Hgb urine dipstick: NEGATIVE
Nitrite: NEGATIVE
Specific Gravity, Urine: 1.025 (ref 1.005–1.030)
pH: 6 (ref 5.0–8.0)

## 2022-05-15 LAB — RAPID INFLUENZA A&B ANTIGENS
Influenza A (ARMC): NEGATIVE
Influenza B (ARMC): NEGATIVE

## 2022-05-15 LAB — WET PREP, GENITAL
Sperm: NONE SEEN
Trich, Wet Prep: NONE SEEN
WBC, Wet Prep HPF POC: <10 — AB (ref <10)
Yeast Wet Prep HPF POC: NONE SEEN

## 2022-05-15 LAB — URINALYSIS, MICROSCOPIC (REFLEX): WBC, UA: >50 WBC/hpf (ref 0–5)

## 2022-05-15 MED ORDER — METRONIDAZOLE 500 MG PO TABS: 500.0000 mg | ORAL_TABLET | Freq: Two times a day (BID) | ORAL | 0 refills | Status: AC

## 2022-05-15 NOTE — ED Triage Notes (Signed)
Pt presents with center chest pain x 4 days and lower abdominal pain x 2 days. Pt denies any other symptoms.

## 2022-05-15 NOTE — ED Provider Notes (Signed)
[facility]    CSN: [id number] Arrival date & time: 05/15/22  1235      History   Chief Complaint Chief Complaint  Patient presents with   Chest Pain   Abdominal Pain    HPI Lindsey Campos is a 46 y.o. female presenting for 4-day history of central chest pressure.  This pressure does not radiate and is not associate with any chest pain or shortness of breath.  She has not had any fevers, cough or congestion.  No report of palpitations, dizziness, weakness, presyncope or syncope.  Denies any injuries.  Has not taken any medication for symptoms.  Also does report mild generalized body aches for the past couple days.  Personal history of COVID-19 2 months ago.  Additionally she reports lower abdominal pain for the past 2 days and urinary frequency.  Denies associated fever, painful urination, hematuria, flank pain, vaginal discharge, odor or itching.  No concern reported for STIs.  Has not taken any medication for symptoms.  History is significant for COPD.  She is continued smoker.  HPI  Past Medical History:  Diagnosis Date   COPD (chronic obstructive pulmonary disease) (HCC)    Emphysema lung (HCC)     Patient Active Problem List   Diagnosis Date Noted   Sepsis (HCC) 01/04/2016   Nausea and vomiting 01/04/2016   UTI (lower urinary tract infection) 01/04/2016   COPD (chronic obstructive pulmonary disease) (HCC) 01/04/2016    History reviewed. No pertinent surgical history.  OB History     Gravida  10   Para  4   Term  3   Preterm  1   AB  6   Living  4      SAB  2   IAB  4   Ectopic      Multiple      Live Births               Home Medications    Prior to Admission medications   Medication Sig Start Date End Date Taking? Authorizing Provider  metroNIDAZOLE (FLAGYL) 500 MG tablet Take 1 tablet (500 mg total) by mouth 2 (two) times daily for 7 days. 05/15/22 05/22/22 Yes Shirlee Latch, PA-C  albuterol (VENTOLIN HFA) 108 (90  Base) MCG/ACT inhaler Inhale 1-2 puffs into the lungs every 6 (six) hours as needed for wheezing or shortness of breath. 01/21/21 02/20/21  Tommie Sams, DO  DULoxetine (CYMBALTA) 30 MG capsule Take 1 capsule by mouth daily. 12/26/21 12/26/22  [provider]  escitalopram (LEXAPRO) 5 MG tablet Take by mouth. 01/05/19   [provider]  TRELEGY ELLIPTA 100-62.5-25 MCG/ACT AEPB Inhale 1 puff into the lungs daily. 04/23/22   [provider]  famotidine (PEPCID) 20 MG tablet Take 1 tablet (20 mg total) by mouth 2 (two) times daily. 01/09/16 11/16/20  Emily Filbert, MD  potassium chloride (K-DUR) 10 MEQ tablet Take 1 tablet (10 mEq total) by mouth daily. 01/06/16 11/16/20  Alford Highland, MD  promethazine (PHENERGAN) 25 MG tablet Take 1 tablet (25 mg total) by mouth every 6 (six) hours as needed for nausea or vomiting. 01/09/16 11/16/20  Emily Filbert, MD    Family History Family History  Problem Relation Age of Onset   Hyperlipidemia Mother    Hypertension Mother    Hyperlipidemia Father    Hypertension Father     Social History Social History   Tobacco Use   Smoking status: Every Day  Packs/day: 1.00    Types: Cigarettes   Smokeless tobacco: Never  Vaping Use   Vaping Use: Never used  Substance Use Topics   Alcohol use: Yes    Comment: occasionally   Drug use: Yes    Types: Marijuana     Allergies   Patient has no known allergies.   Review of Systems Review of Systems  Constitutional:  Negative for fatigue and fever.  HENT:  Negative for congestion, rhinorrhea and sore throat.   Respiratory:  Negative for cough, shortness of breath and wheezing.   Cardiovascular:  Positive for chest pain. Negative for palpitations.  Gastrointestinal:  Positive for abdominal pain. Negative for abdominal distention, anal bleeding, blood in stool, constipation, diarrhea, nausea, rectal pain and vomiting.  Genitourinary:  Positive for frequency. Negative for  difficulty urinating, dysuria, genital sores, pelvic pain, urgency and vaginal discharge.  Musculoskeletal:  Positive for myalgias (mild generalized body aches). Negative for back pain.  Neurological:  Negative for weakness.     Physical Exam Triage Vital Signs ED Triage Vitals  Enc Vitals Group     BP 05/15/22 1253 106/67     Pulse Rate 05/15/22 1253 75     Resp 05/15/22 1253 16     Temp 05/15/22 1253 98.1 F (36.7 C)     Temp Source 05/15/22 1253 Oral     SpO2 05/15/22 1253 97 %     Weight --      Height --      Head Circumference --      Peak Flow --      Pain Score 05/15/22 1252 9     Pain Loc --      Pain Edu? --      Excl. in GC? --    No data found.  Updated Vital Signs BP 106/67 (BP Location: Right Arm)   Pulse 75   Temp 98.1 F (36.7 C) (Oral)   Resp 16   LMP 05/06/2022   SpO2 97%      Physical Exam Vitals and nursing note reviewed.  Constitutional:      General: She is not in acute distress.    Appearance: Normal appearance. She is not ill-appearing or toxic-appearing.  HENT:     Head: Normocephalic and atraumatic.     Nose: Nose normal.     Mouth/Throat:     Mouth: Mucous membranes are moist.     Pharynx: Oropharynx is clear.  Eyes:     General: No scleral icterus.       Right eye: No discharge.        Left eye: No discharge.     Conjunctiva/sclera: Conjunctivae normal.  Cardiovascular:     Rate and Rhythm: Normal rate and regular rhythm.     Heart sounds: Normal heart sounds.  Pulmonary:     Effort: Pulmonary effort is normal. No respiratory distress.     Breath sounds: Normal breath sounds.  Chest:     Chest wall: Tenderness present.  Abdominal:     Palpations: Abdomen is soft.     Tenderness: There is abdominal tenderness (generalized lower abdomen). There is no right CVA tenderness or left CVA tenderness.  Musculoskeletal:     Cervical back: Neck supple.  Skin:    General: Skin is dry.  Neurological:     General: No focal deficit  present.     Mental Status: She is alert. Mental status is at baseline.     Motor: No weakness.  Gait: Gait normal.  Psychiatric:        Mood and Affect: Mood normal.        Behavior: Behavior normal.        Thought Content: Thought content normal.      UC Treatments / Results  Labs (all labs ordered are listed, but only abnormal results are displayed) Labs Reviewed  WET PREP, GENITAL - Abnormal; Notable for the following components:      Result Value   Clue Cells Wet Prep HPF POC PRESENT (*)    WBC, Wet Prep HPF POC <10 (*)    All other components within normal limits  URINALYSIS, ROUTINE W REFLEX MICROSCOPIC - Abnormal; Notable for the following components:   APPearance HAZY (*)    Bilirubin Urine SMALL (*)    Ketones, ur TRACE (*)    Protein, ur TRACE (*)    Leukocytes,Ua MODERATE (*)    All other components within normal limits  URINALYSIS, MICROSCOPIC (REFLEX) - Abnormal; Notable for the following components:   Bacteria, UA FEW (*)    Trichomonas, UA PRESENT (*)    All other components within normal limits  RAPID INFLUENZA A&B ANTIGENS  URINE CULTURE  CERVICOVAGINAL ANCILLARY ONLY    EKG   Radiology DG Chest 2 View  Result Date: 05/15/2022 CLINICAL DATA:  Chest pain 4 days EXAM: CHEST - 2 VIEW COMPARISON:  06/28/2020 FINDINGS: Pulmonary hyperinflation. Lungs clear without infiltrate or effusion. Cardiac and mediastinal contours normal. Negative for heart failure. IMPRESSION: Pulmonary hyperinflation. No acute abnormality. Electronically Signed   By: Marlan Palau M.D.   On: 05/15/2022 13:32    Procedures ED EKG  Date/Time: 05/15/2022 1:34 PM  Performed by: Shirlee Latch, PA-C Authorized by: Shirlee Latch, PA-C   Previous ECG:    Previous ECG:  Unavailable Interpretation:    Interpretation: abnormal     Details:  Possible LAE Rate:    ECG rate:  68   ECG rate assessment: normal   Rhythm:    Rhythm: sinus rhythm   Ectopy:    Ectopy: none    QRS:    QRS axis:  Normal   QRS intervals:  Normal   QRS conduction: normal   ST segments:    ST segments:  Normal T waves:    T waves: normal   Other findings:    Other findings: LAE   Comments:     Normal sinus rhythm. Regular rate. Possible LAE.  (including critical care time)  Medications Ordered in UC Medications - No data to display  Initial Impression / Assessment and Plan / UC Course  I have reviewed the triage vital signs and the nursing notes.  Pertinent labs & imaging results that were available during my care of the patient were reviewed by me and considered in my medical decision making (see chart for details).   46 year old female with history of COPD and tobacco abuse presents for chest pain and generalized body aches for the past 4 days.  Denies fever, fatigue, cough, congestion, shortness of breath, palpitations, dizziness.  No injury to chest.  Additionally she reports lower abdominal pain and urinary frequency for the past 2 days.  Denies severe pain in her chest or abdomen.  Vitals normal and stable and patient is overall well-appearing and in no acute distress.  On exam her chest is diffusely tender to palpation mildly.  Her chest is clear to auscultation heart regular rate and rhythm.  Abdomen is soft with generalized lower abdominal  tenderness.  No guarding or rebound.  No CVA tenderness.  Urinalysis obtained to assess for possible UTI causing lower abdominal pain and urinary frequency.  Also obtain wet prep.  EKG performed for complaint of chest pain reveals no ST or T wave changes.  Chest x-ray also ordered.  Additionally ordered flu test given her generalized body aches and chest pain with her history of COPD.  Urine sample shows hazy urine with moderate leukocytes and few bacteria with white blood cell clumps.  Trichomonas seen on microscopic analysis.  Wet prep is positive for clue cells.  Discussed results with patient.  She appears to be shocked at the  positive trichomonas.  Advised her that we should also check her for gonorrhea and chlamydia.  She is agreeable.  Self swab performed.  We will culture urine and if it shows other bacterial growth, will send additional antibiotics.  Rapid flu testing negative.  Patient reports COVID-19 infection at the end of August.  Unlikely COVID.  Likely musculoskeletal as her chest is tender to palpation.  Advised ibuprofen and Tylenol and supportive care.  Reviewed ED precautions for chest pain with patient.  Suspect her lower abdominal pain is related to the trichomonas infection and BV infection.  We will treat with metronidazole.  Encouraged going to emergency department if the lower abdominal pain worsens at all or she develops a fever.   Final Clinical Impressions(s) / UC Diagnoses   Final diagnoses:  Chest wall pain  Lower abdominal pain  Trichomonas infection  Bacterial vaginosis  Generalized body aches     Discharge Instructions      -Your EKG is essentially normal. -Your chest x-ray is without any acute abnormalities. -You are negative for the flu. -Your pain is most likely musculoskeletal.  Advised taking ibuprofen and Tylenol.  See red flag signs and symptoms below.  If any of those occur go to the emergency department for further work-up of your chest pain.  CHEST PAIN RED FLAGS:  The main red flags to look out for that could mean a heart attack are; pain in the center of the chest, difficulty breathing, nausea, sweating, clammy skin, dizziness, pain radiating to the jaw/neck/arm, racing heart, etc. If any of those symptoms are present, you must immediately get to the ER.   The most common types of vaginal infections are yeast infections and bacterial vaginosis. Neither of which are really considered to be sexually transmitted. Often a pH swab or wet prep is performed and if abnormal may reveal either type of infection. Begin metronidazole if prescribed for possible BV infection. If  there is concern for yeast infection, fluconazole is often prescribed . Take this as directed. You may also apply topical miconazole (can be purchased OTC) externally for relief of itching. Increase rest and fluid intake. If labs sent out, we will call within 2-5 days with results and amend treatment if necessary. Always try to use pH balanced washes/wipes, urinate after intercourse, stay hydrated, and take probiotics if you are prone to vaginal infections. Return or see PCP or gynecologist for new/worsening infections.    -You have bacterial vaginosis as well as trichomonas.  Trichomonas is a sexually transmitted infection.  Both are treated with the same antibiotic.  Take the full course.  Your partner needs to be notified and treated as well.  No sexual intercourse until 1 week after you both have been treated.  Your urine is abnormal as well but I think it is due to the vaginal infection.  We will call you if you need additional antibiotics based on the urine culture. - We are also checking for gonorrhea and chlamydia and we will call you if those results are abnormal.  It should be back in the next day or 2. - Go to emergency department for abdominal pain worsens.     ED Prescriptions     Medication Sig Dispense Auth. Provider   metroNIDAZOLE (FLAGYL) 500 MG tablet Take 1 tablet (500 mg total) by mouth 2 (two) times daily for 7 days. 14 tablet Gareth Morgan      PDMP not reviewed this encounter.   Shirlee Latch, PA-C 05/15/22 1416

## 2022-05-15 NOTE — Discharge Instructions (Addendum)
-  Your EKG is essentially normal. -Your chest x-ray is without any acute abnormalities. -You are negative for the flu. -Your pain is most likely musculoskeletal.  Advised taking ibuprofen and Tylenol.  See red flag signs and symptoms below.  If any of those occur go to the emergency department for further work-up of your chest pain.  CHEST PAIN RED FLAGS:  The main red flags to look out for that could mean a heart attack are; pain in the center of the chest, difficulty breathing, nausea, sweating, clammy skin, dizziness, pain radiating to the jaw/neck/arm, racing heart, etc. If any of those symptoms are present, you must immediately get to the ER.   The most common types of vaginal infections are yeast infections and bacterial vaginosis. Neither of which are really considered to be sexually transmitted. Often a pH swab or wet prep is performed and if abnormal may reveal either type of infection. Begin metronidazole if prescribed for possible BV infection. If there is concern for yeast infection, fluconazole is often prescribed . Take this as directed. You may also apply topical miconazole (can be purchased OTC) externally for relief of itching. Increase rest and fluid intake. If labs sent out, we will call within 2-5 days with results and amend treatment if necessary. Always try to use pH balanced washes/wipes, urinate after intercourse, stay hydrated, and take probiotics if you are prone to vaginal infections. Return or see PCP or gynecologist for new/worsening infections.    -You have bacterial vaginosis as well as trichomonas.  Trichomonas is a sexually transmitted infection.  Both are treated with the same antibiotic.  Take the full course.  Your partner needs to be notified and treated as well.  No sexual intercourse until 1 week after you both have been treated.  Your urine is abnormal as well but I think it is due to the vaginal infection.  We will call you if you need additional antibiotics based on  the urine culture. - We are also checking for gonorrhea and chlamydia and we will call you if those results are abnormal.  It should be back in the next day or 2. - Go to emergency department for abdominal pain worsens.

## 2022-05-16 LAB — CERVICOVAGINAL ANCILLARY ONLY
Chlamydia: NEGATIVE
Comment: NEGATIVE
Comment: NORMAL
Neisseria Gonorrhea: NEGATIVE

## 2022-05-17 LAB — URINE CULTURE

## 2024-05-16 ENCOUNTER — Ambulatory Visit (INDEPENDENT_AMBULATORY_CARE_PROVIDER_SITE_OTHER)

## 2024-05-16 ENCOUNTER — Ambulatory Visit
Admission: EM | Admit: 2024-05-16 | Discharge: 2024-05-16 | Disposition: A | Discharge status: Home / Self Care | Attending: Emergency Medicine | Admitting: Emergency Medicine

## 2024-05-16 ENCOUNTER — Encounter: Payer: Self-pay | Admitting: Emergency Medicine

## 2024-05-16 DIAGNOSIS — M25532 Pain in left wrist: Secondary | ICD-10-CM

## 2024-05-16 DIAGNOSIS — M654 Radial styloid tenosynovitis [de Quervain]: Secondary | ICD-10-CM | POA: Diagnosis not present

## 2024-05-16 MED ORDER — PANTOPRAZOLE SODIUM 20 MG PO TBEC: 20.0000 mg | DELAYED_RELEASE_TABLET | Freq: Every day | ORAL | 0 refills | Status: AC

## 2024-05-16 MED ORDER — IBUPROFEN 600 MG PO TABS
600.0000 mg | ORAL_TABLET | Freq: Four times a day (QID) | ORAL | 0 refills | Status: AC | PRN
Start: 2024-05-16 — End: ?

## 2024-05-16 MED ORDER — METHYLPREDNISOLONE 4 MG PO TBPK: ORAL_TABLET | Freq: Every day | ORAL | 0 refills | Status: AC

## 2024-05-16 NOTE — ED Provider Notes (Signed)
 HPI  SUBJECTIVE:  Lindsey Campos is a right-handed 48 y.o. female who presents with returned radial left wrist pain with pain going up into her index and thumb starting 2 days ago.  She states the pain is achy, constant.  She drives a forklift at work.  No other recent overuse, fall, or trauma to the wrist.  She notes index finger swelling starting today.  No fevers, erythema or swelling of the wrist.  She has tried Epsom salt soaks, IcyHot, Bengay, tramadol , rest, and has been taking leftover prednisone  10 mg twice a day for the past week.  She has been wearing her thumb spica and a compressive wrist brace intermittently.  No alleviating factors.  Symptoms worse with flexion/extension, radial/ulnar deviation with wearing the thumb spica for prolonged periods of time.  She originally injured her left wrist at work 2 months ago while pushing a heavy box to the top of the stack.  States this pain is identical to that, but feels worse.  Past medical history of COPD and left wrist seen in the ED on 9/10 for left wrist pain, x-rays normal, found to have a sprain, sent home with Tylenol /ibuprofen , ice and placed in a brace.  She saw her PCP in October for same left wrist pain and was found to have a sprain and de Quervain's tenosynovitis.  Past Medical History:  Diagnosis Date   COPD (chronic obstructive pulmonary disease) (HCC)    Emphysema lung (HCC)     History reviewed. No pertinent surgical history.  Family History  Problem Relation Age of Onset   Hyperlipidemia Mother    Hypertension Mother    Hyperlipidemia Father    Hypertension Father     Social History   Tobacco Use   Smoking status: Every Day    Current packs/day: 1.00    Types: Cigarettes   Smokeless tobacco: Never  Vaping Use   Vaping status: Never Used  Substance Use Topics   Alcohol use: Yes    Comment: occasionally   Drug use: Yes    Types: Marijuana    No current facility-administered medications for this  encounter.  Current Outpatient Medications:    ibuprofen  (ADVIL ) 600 MG tablet, Take 1 tablet (600 mg total) by mouth every 6 (six) hours as needed., Disp: 30 tablet, Rfl: 0   methylPREDNISolone (MEDROL DOSEPAK) 4 MG TBPK tablet, Take by mouth daily. Follow package instructions, Disp: 21 tablet, Rfl: 0   pantoprazole (PROTONIX) 20 MG tablet, Take 1 tablet (20 mg total) by mouth daily for 7 days., Disp: 7 tablet, Rfl: 0   TRELEGY ELLIPTA 100-62.5-25 MCG/ACT AEPB, Inhale 1 puff into the lungs daily., Disp: , Rfl:    albuterol  (VENTOLIN  HFA) 108 (90 Base) MCG/ACT inhaler, Inhale 1-2 puffs into the lungs every 6 (six) hours as needed for wheezing or shortness of breath., Disp: 1 each, Rfl: 2   DULoxetine (CYMBALTA) 30 MG capsule, Take 1 capsule by mouth daily., Disp: , Rfl:    escitalopram (LEXAPRO) 5 MG tablet, Take by mouth., Disp: , Rfl:   No Known Allergies   ROS  As noted in HPI.   Physical Exam  BP 124/89 (BP Location: Left Arm)   Pulse 91   Temp 98.9 F (37.2 C) (Oral)   Resp 19   Wt 50.3 kg   SpO2 99%   BMI 18.76 kg/m   Constitutional: Well developed, well nourished, no acute distress Eyes:  EOMI, conjunctiva normal bilaterally HENT: Normocephalic, atraumatic,mucus membranes moist Respiratory: Normal  inspiratory effort Cardiovascular: Normal rate GI: nondistended skin: No rash, skin intact Musculoskeletal:  L  distal radius tender, distal ulnar styloid NT, snuffbox NT, carpals tender, no dorsal soft tissue swelling, 1st and 2nd metacarpals  tender, thumb and index diffusely tender.  Tenderness over the APB/EPB.  No apparent erythema, finger swelling.  Digits otherwise NT , TFCC NT .  pain  with supination,  pain  with pronation, pain with ulnar deviation.  No pain with radial deviation.Pain  with flexion/extension.  Motor intact in the median/radial/ulnar distribution, Sensation LT to hand normal . Rp 2+.  No bruising, erythema, edema.  Tinel  neg. Phalen neg.  [place]in  Pos. proximal forearm NT.  Neurologic: Alert & oriented x 3, no focal neuro deficits Psychiatric: Speech and behavior appropriate   ED Course   Medications - No data to display  Orders Placed This Encounter  Procedures   DG Wrist Complete Left    Standing Status:   Standing    Number of Occurrences:   1    Reason for Exam (SYMPTOM  OR DIAGNOSIS REQUIRED):   radial wrst pain carpal tenderness, 1st, 2nd MC and thumb.index finger tender r/o acute changes. H/o L wrist injury, dequervains   DG Hand Complete Left    Standing Status:   Standing    Number of Occurrences:   1    Reason for Exam (SYMPTOM  OR DIAGNOSIS REQUIRED):   radial wrst pain carpal tenderness, 1st, 2nd MC and thumb.index finger tender r/o acute changes. H/o L wrist injury, dequervains    No results found for this or any previous visit (from the past 24 hours). No results found.  ED Clinical Impression  1. De Quervain's tenosynovitis, left   2. Left wrist pain      ED Assessment/Plan   {The patient has been seen in Urgent Care in the last 3 years. :1}  Outside records reviewed.  As noted in HPI.  Patient presents with an acute worsening of a chronic issue.  Will recheck left hand and wrist x-ray.  There is no evidence of infection, ganglion cyst.  Suspect de Quervain's tenosynovitis and residual damage from initial injury.  Home with Tylenol /ibuprofen , Medrol Dosepak.  Will start on Protonix for a week to help prevent a gastritis/PUD.  She is to wear her thumb spica but take her wrist out from time to time and move it around.  Follow-up with [facility] ASAP.  Reviewed imaging independently.  No fracture, dislocation as read by me.  Formal radiology over read pending.  Will contact patient if radiology overread differs enough from mine and we need to change management.   Reviewed radiology report.  No fracture, dislocation consistent with my read.  See radiology report for full details.  X-ray negative for  acute changes.  Plan as above.  Discussed imaging, MDM, treatment plan, and plan for follow-up with patient. patient agrees with plan.   Meds ordered this encounter  Medications   ibuprofen  (ADVIL ) 600 MG tablet    Sig: Take 1 tablet (600 mg total) by mouth every 6 (six) hours as needed.    Dispense:  30 tablet    Refill:  0   methylPREDNISolone (MEDROL DOSEPAK) 4 MG TBPK tablet    Sig: Take by mouth daily. Follow package instructions    Dispense:  21 tablet    Refill:  0   pantoprazole (PROTONIX) 20 MG tablet    Sig: Take 1 tablet (20 mg total) by mouth daily for 7  days.    Dispense:  7 tablet    Refill:  0      *This clinic note was created using Scientist, clinical (histocompatibility and immunogenetics). Therefore, there may be occasional mistakes despite careful proofreading.  ?

## 2024-05-16 NOTE — ED Triage Notes (Signed)
 Patient states that she having pain in her left hand. Injury to left hand 2 months ago. Was put in a brace. Used brace. Patient states that had is hurting and starting to swell again.

## 2024-05-16 NOTE — Discharge Instructions (Addendum)
 I did not appreciate any fracture or dislocation on your x-ray.  We will contact you if the radiology overread differs in the from mine and we need to change management.  Stop the prednisone  and try the Medrol Dosepak.  Take 600 mg of ibuprofen , 1000 mg of Tylenol  together 3-4 times a day as needed for pain.  The Protonix will help protect your stomach from ulcers and inflammation.  If your stomach starts to hurt, stop the ibuprofen  immediately.  If you start start having black or tarry stools, severe abdominal pain, seek medical attention.  Wear your black thumb spica splint but do take it out from time to time and move your wrist around.  Follow-up with [facility] ASAP.
